# Patient Record
Sex: Female | Born: 1980
Health system: Southern US, Community
[De-identification: ages and names within clinical notes are randomized; demographics above are authoritative.]

## PROBLEM LIST (undated history)

## (undated) ENCOUNTER — Inpatient Hospital Stay (HOSPITAL_COMMUNITY): Payer: Self-pay

## (undated) DIAGNOSIS — Z8619 Personal history of other infectious and parasitic diseases: Secondary | ICD-10-CM

## (undated) DIAGNOSIS — F1011 Alcohol abuse, in remission: Secondary | ICD-10-CM

## (undated) DIAGNOSIS — F988 Other specified behavioral and emotional disorders with onset usually occurring in childhood and adolescence: Secondary | ICD-10-CM

## (undated) DIAGNOSIS — F329 Major depressive disorder, single episode, unspecified: Secondary | ICD-10-CM

## (undated) DIAGNOSIS — R011 Cardiac murmur, unspecified: Secondary | ICD-10-CM

## (undated) DIAGNOSIS — F32A Depression, unspecified: Secondary | ICD-10-CM

## (undated) DIAGNOSIS — T4145XA Adverse effect of unspecified anesthetic, initial encounter: Secondary | ICD-10-CM

## (undated) DIAGNOSIS — N888 Other specified noninflammatory disorders of cervix uteri: Secondary | ICD-10-CM

## (undated) DIAGNOSIS — F419 Anxiety disorder, unspecified: Secondary | ICD-10-CM

## (undated) DIAGNOSIS — T8859XA Other complications of anesthesia, initial encounter: Secondary | ICD-10-CM

## (undated) DIAGNOSIS — A749 Chlamydial infection, unspecified: Secondary | ICD-10-CM

## (undated) HISTORY — DX: Personal history of other infectious and parasitic diseases: Z86.19

## (undated) HISTORY — PX: WISDOM TOOTH EXTRACTION: SHX21

## (undated) HISTORY — DX: Alcohol abuse, in remission: F10.11

---

## 2007-11-26 ENCOUNTER — Emergency Department (HOSPITAL_COMMUNITY): Admission: EM | Admit: 2007-11-26 | Discharge: 2007-11-27 | Payer: Self-pay | Admitting: Emergency Medicine

## 2008-01-28 ENCOUNTER — Emergency Department (HOSPITAL_COMMUNITY): Admission: EM | Admit: 2008-01-28 | Discharge: 2008-01-28 | Payer: Self-pay | Admitting: Emergency Medicine

## 2008-11-23 ENCOUNTER — Emergency Department (HOSPITAL_COMMUNITY): Admission: EM | Admit: 2008-11-23 | Discharge: 2008-11-23 | Payer: Self-pay | Admitting: Emergency Medicine

## 2009-04-10 ENCOUNTER — Emergency Department (HOSPITAL_COMMUNITY)
Admission: EM | Admit: 2009-04-10 | Discharge: 2009-04-10 | Payer: Self-pay | Source: Home / Self Care | Admitting: Emergency Medicine

## 2009-11-07 ENCOUNTER — Emergency Department (HOSPITAL_COMMUNITY)
Admission: EM | Admit: 2009-11-07 | Discharge: 2009-11-07 | Payer: Self-pay | Source: Home / Self Care | Admitting: Emergency Medicine

## 2009-12-04 ENCOUNTER — Emergency Department (HOSPITAL_COMMUNITY)
Admission: EM | Admit: 2009-12-04 | Discharge: 2009-12-05 | Payer: Self-pay | Source: Home / Self Care | Admitting: Emergency Medicine

## 2009-12-14 ENCOUNTER — Ambulatory Visit: Payer: Self-pay | Admitting: Obstetrics & Gynecology

## 2009-12-17 ENCOUNTER — Emergency Department (HOSPITAL_COMMUNITY): Admission: EM | Admit: 2009-12-17 | Discharge: 2009-12-17 | Payer: Self-pay | Source: Home / Self Care

## 2009-12-19 ENCOUNTER — Inpatient Hospital Stay (HOSPITAL_COMMUNITY)
Admission: AD | Admit: 2009-12-19 | Discharge: 2009-12-19 | Payer: Self-pay | Source: Home / Self Care | Admitting: Family Medicine

## 2009-12-19 ENCOUNTER — Ambulatory Visit: Payer: Self-pay | Admitting: Family Medicine

## 2009-12-19 ENCOUNTER — Encounter: Payer: Self-pay | Admitting: Family Medicine

## 2010-01-10 ENCOUNTER — Encounter: Payer: Self-pay | Admitting: Emergency Medicine

## 2010-01-11 ENCOUNTER — Inpatient Hospital Stay (HOSPITAL_COMMUNITY): Admission: AD | Admit: 2010-01-11 | Discharge: 2010-01-12 | Payer: Self-pay | Admitting: Obstetrics and Gynecology

## 2010-03-13 ENCOUNTER — Emergency Department (HOSPITAL_COMMUNITY)
Admission: EM | Admit: 2010-03-13 | Discharge: 2010-03-13 | Payer: Self-pay | Source: Home / Self Care | Admitting: Emergency Medicine

## 2010-05-07 LAB — DIFFERENTIAL
Eosinophils Absolute: 0 10*3/uL (ref 0.0–0.7)
Eosinophils Relative: 0 % (ref 0–5)
Eosinophils Relative: 1 % (ref 0–5)
Lymphocytes Relative: 15 % (ref 12–46)
Lymphs Abs: 1 10*3/uL (ref 0.7–4.0)
Lymphs Abs: 2 10*3/uL (ref 0.7–4.0)
Monocytes Absolute: 0.7 10*3/uL (ref 0.1–1.0)
Monocytes Absolute: 1.1 10*3/uL — ABNORMAL HIGH (ref 0.1–1.0)
Monocytes Relative: 4 % (ref 3–12)

## 2010-05-07 LAB — URINALYSIS, ROUTINE W REFLEX MICROSCOPIC
Glucose, UA: NEGATIVE mg/dL
Ketones, ur: NEGATIVE mg/dL
Protein, ur: NEGATIVE mg/dL
Urobilinogen, UA: 0.2 mg/dL (ref 0.0–1.0)

## 2010-05-07 LAB — GC/CHLAMYDIA PROBE AMP, GENITAL
Chlamydia, DNA Probe: NEGATIVE
GC Probe Amp, Genital: NEGATIVE

## 2010-05-07 LAB — BASIC METABOLIC PANEL
CO2: 23 mEq/L (ref 19–32)
Chloride: 102 mEq/L (ref 96–112)
GFR calc Af Amer: >60 mL/min (ref >60)
Glucose, Bld: 103 mg/dL — ABNORMAL HIGH (ref 70–99)
Sodium: 134 mEq/L — ABNORMAL LOW (ref 135–145)

## 2010-05-07 LAB — CBC
HCT: 35.7 % — ABNORMAL LOW (ref 36.0–46.0)
HCT: 33.3 % — ABNORMAL LOW (ref 36.0–46.0)
Hemoglobin: 11.9 g/dL — ABNORMAL LOW (ref 12.0–15.0)
MCH: 28.8 pg (ref 26.0–34.0)
MCV: 86.3 fL (ref 78.0–100.0)
MCV: 87.6 fL (ref 78.0–100.0)
RBC: 4.14 MIL/uL (ref 3.87–5.11)
RBC: 3.8 MIL/uL — ABNORMAL LOW (ref 3.87–5.11)
RDW: 14.2 % (ref 11.5–15.5)
WBC: 13.2 10*3/uL — ABNORMAL HIGH (ref 4.0–10.5)

## 2010-05-07 LAB — WET PREP, GENITAL

## 2010-05-07 LAB — LIPASE, BLOOD: Lipase: 22 U/L (ref 11–59)

## 2010-05-07 LAB — HEPATIC FUNCTION PANEL
Albumin: 3.8 g/dL (ref 3.5–5.2)
Alkaline Phosphatase: 57 U/L (ref 39–117)
Bilirubin, Direct: 0.1 mg/dL (ref 0.0–0.3)
Total Bilirubin: 0.4 mg/dL (ref 0.3–1.2)

## 2010-05-07 LAB — HCG, QUANTITATIVE, PREGNANCY: hCG, Beta Chain, Quant, S: 39136 m[IU]/mL — ABNORMAL HIGH (ref <5)

## 2010-05-08 LAB — URINALYSIS, ROUTINE W REFLEX MICROSCOPIC
Nitrite: NEGATIVE
Specific Gravity, Urine: 1.02 (ref 1.005–1.030)
Urobilinogen, UA: 0.2 mg/dL (ref 0.0–1.0)

## 2010-05-08 LAB — CBC
HCT: 38.3 % (ref 36.0–46.0)
Hemoglobin: 12.5 g/dL (ref 12.0–15.0)
MCH: 28.6 pg (ref 26.0–34.0)
MCHC: 32.7 g/dL (ref 30.0–36.0)

## 2010-05-08 LAB — WET PREP, GENITAL: Clue Cells Wet Prep HPF POC: NONE SEEN

## 2010-05-08 LAB — RAPID URINE DRUG SCREEN, HOSP PERFORMED
Barbiturates: NOT DETECTED
Opiates: NOT DETECTED

## 2010-05-09 LAB — URINALYSIS, ROUTINE W REFLEX MICROSCOPIC
Bilirubin Urine: NEGATIVE
Glucose, UA: NEGATIVE mg/dL
Hgb urine dipstick: NEGATIVE
Ketones, ur: NEGATIVE mg/dL
Nitrite: NEGATIVE
Protein, ur: NEGATIVE mg/dL
Protein, ur: NEGATIVE mg/dL
Specific Gravity, Urine: 1.009 (ref 1.005–1.030)
Urobilinogen, UA: 1 mg/dL (ref 0.0–1.0)

## 2010-05-09 LAB — URINE MICROSCOPIC-ADD ON

## 2010-05-09 LAB — DIFFERENTIAL
Basophils Absolute: 0 10*3/uL (ref 0.0–0.1)
Eosinophils Absolute: 0.1 10*3/uL (ref 0.0–0.7)
Lymphs Abs: 2.8 10*3/uL (ref 0.7–4.0)
Neutrophils Relative %: 69 % (ref 43–77)

## 2010-05-09 LAB — GC/CHLAMYDIA PROBE AMP, GENITAL
Chlamydia, DNA Probe: POSITIVE — AB
GC Probe Amp, Genital: NEGATIVE

## 2010-05-09 LAB — CBC
MCH: 28.1 pg (ref 26.0–34.0)
Platelets: 220 10*3/uL (ref 150–400)
RBC: 4.49 MIL/uL (ref 3.87–5.11)
WBC: 13 10*3/uL — ABNORMAL HIGH (ref 4.0–10.5)

## 2010-05-09 LAB — ABO/RH: ABO/RH(D): O NEG

## 2010-05-09 LAB — HCG, QUANTITATIVE, PREGNANCY: hCG, Beta Chain, Quant, S: 1685 m[IU]/mL — ABNORMAL HIGH (ref <5)

## 2010-05-09 LAB — WET PREP, GENITAL: Trich, Wet Prep: NONE SEEN

## 2010-05-15 LAB — COMPREHENSIVE METABOLIC PANEL
AST: 18 U/L (ref 0–37)
Albumin: 4 g/dL (ref 3.5–5.2)
BUN: 11 mg/dL (ref 6–23)
Calcium: 9.1 mg/dL (ref 8.4–10.5)
Creatinine, Ser: 0.82 mg/dL (ref 0.4–1.2)
GFR calc Af Amer: >60 mL/min (ref >60)

## 2010-05-15 LAB — DIFFERENTIAL
Eosinophils Relative: 1 % (ref 0–5)
Lymphocytes Relative: 24 % (ref 12–46)
Lymphs Abs: 2.5 10*3/uL (ref 0.7–4.0)
Monocytes Absolute: 0.8 10*3/uL (ref 0.1–1.0)
Neutro Abs: 6.7 10*3/uL (ref 1.7–7.7)

## 2010-05-15 LAB — CBC
MCHC: 33 g/dL (ref 30.0–36.0)
MCV: 85.8 fL (ref 78.0–100.0)
Platelets: 230 10*3/uL (ref 150–400)
RDW: 14.4 % (ref 11.5–15.5)
WBC: 10.1 10*3/uL (ref 4.0–10.5)

## 2010-05-31 LAB — POCT PREGNANCY, URINE: Preg Test, Ur: NEGATIVE

## 2010-07-05 ENCOUNTER — Emergency Department (HOSPITAL_COMMUNITY)
Admission: EM | Admit: 2010-07-05 | Discharge: 2010-07-06 | Disposition: A | Discharge status: Home / Self Care | Payer: Medicaid Other | Attending: Emergency Medicine | Admitting: Emergency Medicine

## 2010-07-05 DIAGNOSIS — T148XXA Other injury of unspecified body region, initial encounter: Secondary | ICD-10-CM | POA: Insufficient documentation

## 2010-07-05 DIAGNOSIS — R079 Chest pain, unspecified: Secondary | ICD-10-CM | POA: Insufficient documentation

## 2010-07-05 DIAGNOSIS — W1809XA Striking against other object with subsequent fall, initial encounter: Secondary | ICD-10-CM | POA: Insufficient documentation

## 2010-07-06 ENCOUNTER — Emergency Department (HOSPITAL_COMMUNITY): Payer: Medicaid Other

## 2010-09-30 ENCOUNTER — Inpatient Hospital Stay (HOSPITAL_COMMUNITY): Payer: Medicaid Other

## 2010-09-30 ENCOUNTER — Inpatient Hospital Stay (HOSPITAL_COMMUNITY)
Admission: AD | Admit: 2010-09-30 | Discharge: 2010-09-30 | Disposition: A | Discharge status: Home / Self Care | Payer: Medicaid Other | Source: Ambulatory Visit | Attending: Obstetrics & Gynecology | Admitting: Obstetrics & Gynecology

## 2010-09-30 ENCOUNTER — Encounter (HOSPITAL_COMMUNITY): Payer: Self-pay | Admitting: *Deleted

## 2010-09-30 DIAGNOSIS — N939 Abnormal uterine and vaginal bleeding, unspecified: Secondary | ICD-10-CM

## 2010-09-30 DIAGNOSIS — N898 Other specified noninflammatory disorders of vagina: Secondary | ICD-10-CM

## 2010-09-30 DIAGNOSIS — N920 Excessive and frequent menstruation with regular cycle: Secondary | ICD-10-CM | POA: Insufficient documentation

## 2010-09-30 HISTORY — DX: Other specified noninflammatory disorders of cervix uteri: N88.8

## 2010-09-30 HISTORY — DX: Other specified behavioral and emotional disorders with onset usually occurring in childhood and adolescence: F98.8

## 2010-09-30 HISTORY — DX: Major depressive disorder, single episode, unspecified: F32.9

## 2010-09-30 HISTORY — DX: Depression, unspecified: F32.A

## 2010-09-30 HISTORY — DX: Cardiac murmur, unspecified: R01.1

## 2010-09-30 HISTORY — DX: Chlamydial infection, unspecified: A74.9

## 2010-09-30 HISTORY — DX: Anxiety disorder, unspecified: F41.9

## 2010-09-30 LAB — URINALYSIS, ROUTINE W REFLEX MICROSCOPIC
Bilirubin Urine: NEGATIVE
Nitrite: NEGATIVE
Specific Gravity, Urine: >1.03 — ABNORMAL HIGH (ref 1.005–1.030)
Urobilinogen, UA: 4 mg/dL — ABNORMAL HIGH (ref 0.0–1.0)
pH: 6.5 (ref 5.0–8.0)

## 2010-09-30 LAB — URINE MICROSCOPIC-ADD ON

## 2010-09-30 LAB — CBC
HCT: 35.1 % — ABNORMAL LOW (ref 36.0–46.0)
Hemoglobin: 11.1 g/dL — ABNORMAL LOW (ref 12.0–15.0)
MCHC: 31.6 g/dL (ref 30.0–36.0)
WBC: 12.1 10*3/uL — ABNORMAL HIGH (ref 4.0–10.5)

## 2010-09-30 LAB — WET PREP, GENITAL
Trich, Wet Prep: NONE SEEN
Yeast Wet Prep HPF POC: NONE SEEN

## 2010-09-30 LAB — POCT PREGNANCY, URINE: Preg Test, Ur: NEGATIVE

## 2010-09-30 MED ORDER — GI COCKTAIL ~~LOC~~
30.0000 mL | Freq: Three times a day (TID) | ORAL | Status: DC | PRN
  Administered 2010-09-30: 30 mL via ORAL
  Filled 2010-09-30: qty 30

## 2010-09-30 MED ORDER — KETOROLAC TROMETHAMINE 60 MG/2ML IM SOLN
60.0000 mg | Freq: Once | INTRAMUSCULAR | Status: AC
  Administered 2010-09-30: 60 mg via INTRAMUSCULAR
  Filled 2010-09-30: qty 2

## 2010-09-30 NOTE — Progress Notes (Signed)
Pt states has bled this entire month, heavy then spotting, towards normal cycle time had heavy bleeding and clots. Unsure if pregnant. Having lower abdominal pains/cramping as well, bilaterally.Denies vaginal d/c changes besides blood.
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
## 2010-09-30 NOTE — ED Notes (Signed)
Pt. On cell phone constantly. Did not hang up when provider discussed plan of care or did pelvic exam.
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
## 2010-10-18 ENCOUNTER — Emergency Department (HOSPITAL_COMMUNITY)
Admission: EM | Admit: 2010-10-18 | Discharge: 2010-10-18 | Disposition: A | Discharge status: Home / Self Care | Payer: Medicaid Other | Attending: Emergency Medicine | Admitting: Emergency Medicine

## 2010-10-18 DIAGNOSIS — F341 Dysthymic disorder: Secondary | ICD-10-CM | POA: Insufficient documentation

## 2010-10-18 DIAGNOSIS — F411 Generalized anxiety disorder: Secondary | ICD-10-CM | POA: Insufficient documentation

## 2010-10-18 DIAGNOSIS — J020 Streptococcal pharyngitis: Secondary | ICD-10-CM | POA: Insufficient documentation

## 2010-10-18 DIAGNOSIS — R51 Headache: Secondary | ICD-10-CM | POA: Insufficient documentation

## 2010-10-18 DIAGNOSIS — R599 Enlarged lymph nodes, unspecified: Secondary | ICD-10-CM | POA: Insufficient documentation

## 2010-10-18 LAB — RAPID STREP SCREEN (MED CTR MEBANE ONLY): Streptococcus, Group A Screen (Direct): POSITIVE — AB

## 2011-06-07 ENCOUNTER — Emergency Department (INDEPENDENT_AMBULATORY_CARE_PROVIDER_SITE_OTHER): Payer: Self-pay

## 2011-06-07 ENCOUNTER — Emergency Department (HOSPITAL_BASED_OUTPATIENT_CLINIC_OR_DEPARTMENT_OTHER)
Admission: EM | Admit: 2011-06-07 | Discharge: 2011-06-07 | Disposition: A | Discharge status: Home / Self Care | Payer: Self-pay | Attending: Emergency Medicine | Admitting: Emergency Medicine

## 2011-06-07 DIAGNOSIS — Z0489 Encounter for examination and observation for other specified reasons: Secondary | ICD-10-CM

## 2011-06-07 DIAGNOSIS — S01511A Laceration without foreign body of lip, initial encounter: Secondary | ICD-10-CM

## 2011-06-07 DIAGNOSIS — F988 Other specified behavioral and emotional disorders with onset usually occurring in childhood and adolescence: Secondary | ICD-10-CM | POA: Insufficient documentation

## 2011-06-07 DIAGNOSIS — T07XXXA Unspecified multiple injuries, initial encounter: Secondary | ICD-10-CM

## 2011-06-07 DIAGNOSIS — F341 Dysthymic disorder: Secondary | ICD-10-CM | POA: Insufficient documentation

## 2011-06-07 DIAGNOSIS — J9819 Other pulmonary collapse: Secondary | ICD-10-CM

## 2011-06-07 DIAGNOSIS — S01501A Unspecified open wound of lip, initial encounter: Secondary | ICD-10-CM | POA: Insufficient documentation

## 2011-06-07 DIAGNOSIS — R011 Cardiac murmur, unspecified: Secondary | ICD-10-CM

## 2011-06-07 DIAGNOSIS — R220 Localized swelling, mass and lump, head: Secondary | ICD-10-CM | POA: Insufficient documentation

## 2011-06-07 LAB — CBC
HCT: 40.8 % (ref 36.0–46.0)
MCV: 85.2 fL (ref 78.0–100.0)
Platelets: 229 10*3/uL (ref 150–400)
RBC: 4.79 MIL/uL (ref 3.87–5.11)
RDW: 13.2 % (ref 11.5–15.5)
WBC: 26.4 10*3/uL — ABNORMAL HIGH (ref 4.0–10.5)

## 2011-06-07 LAB — DIFFERENTIAL
Basophils Absolute: 0 10*3/uL (ref 0.0–0.1)
Eosinophils Relative: 0 % (ref 0–5)
Lymphocytes Relative: 6 % — ABNORMAL LOW (ref 12–46)
Monocytes Relative: 5 % (ref 3–12)
Neutro Abs: 23.5 10*3/uL — ABNORMAL HIGH (ref 1.7–7.7)

## 2011-06-07 LAB — BASIC METABOLIC PANEL
CO2: 23 mEq/L (ref 19–32)
Chloride: 107 mEq/L (ref 96–112)
GFR calc Af Amer: >90 mL/min (ref >90)
Potassium: 3.2 mEq/L — ABNORMAL LOW (ref 3.5–5.1)

## 2011-06-07 LAB — PREGNANCY, URINE: Preg Test, Ur: NEGATIVE

## 2011-06-07 MED ORDER — IOHEXOL 300 MG/ML  SOLN
100.0000 mL | Freq: Once | INTRAMUSCULAR | Status: AC | PRN
  Administered 2011-06-07: 100 mL via INTRAVENOUS

## 2011-06-07 MED ORDER — TRAMADOL HCL 50 MG PO TABS
50.0000 mg | ORAL_TABLET | Freq: Once | ORAL | Status: AC
  Administered 2011-06-07: 50 mg via ORAL
  Filled 2011-06-07: qty 1

## 2011-06-07 MED ORDER — TRAMADOL HCL 50 MG PO TABS: 50.0000 mg | ORAL_TABLET | Freq: Four times a day (QID) | ORAL | Status: DC | PRN
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
## 2011-06-12 ENCOUNTER — Emergency Department (HOSPITAL_COMMUNITY)
Admission: EM | Admit: 2011-06-12 | Discharge: 2011-06-12 | Disposition: A | Discharge status: Home / Self Care | Payer: Self-pay | Attending: Emergency Medicine | Admitting: Emergency Medicine

## 2011-06-12 ENCOUNTER — Emergency Department (HOSPITAL_COMMUNITY): Payer: Self-pay

## 2011-06-12 ENCOUNTER — Encounter (HOSPITAL_COMMUNITY): Payer: Self-pay | Admitting: Emergency Medicine

## 2011-06-12 DIAGNOSIS — R071 Chest pain on breathing: Secondary | ICD-10-CM | POA: Insufficient documentation

## 2011-06-12 DIAGNOSIS — M62838 Other muscle spasm: Secondary | ICD-10-CM | POA: Insufficient documentation

## 2011-06-12 DIAGNOSIS — F341 Dysthymic disorder: Secondary | ICD-10-CM | POA: Insufficient documentation

## 2011-06-12 DIAGNOSIS — M549 Dorsalgia, unspecified: Secondary | ICD-10-CM | POA: Insufficient documentation

## 2011-06-12 DIAGNOSIS — R0789 Other chest pain: Secondary | ICD-10-CM

## 2011-06-12 LAB — BASIC METABOLIC PANEL
BUN: 12 mg/dL (ref 6–23)
CO2: 23 mEq/L (ref 19–32)
Chloride: 106 mEq/L (ref 96–112)
GFR calc Af Amer: >90 mL/min (ref >90)
Glucose, Bld: 97 mg/dL (ref 70–99)
Potassium: 3.9 mEq/L (ref 3.5–5.1)

## 2011-06-12 LAB — CBC
HCT: 38.1 % (ref 36.0–46.0)
Hemoglobin: 12.3 g/dL (ref 12.0–15.0)
RBC: 4.42 MIL/uL (ref 3.87–5.11)

## 2011-06-12 LAB — DIFFERENTIAL
Lymphocytes Relative: 26 % (ref 12–46)
Lymphs Abs: 2.3 10*3/uL (ref 0.7–4.0)
Monocytes Absolute: 0.6 10*3/uL (ref 0.1–1.0)
Monocytes Relative: 6 % (ref 3–12)
Neutro Abs: 5.8 10*3/uL (ref 1.7–7.7)
Neutrophils Relative %: 66 % (ref 43–77)

## 2011-06-12 MED ORDER — OXYCODONE-ACETAMINOPHEN 5-325 MG PO TABS
2.0000 | ORAL_TABLET | Freq: Once | ORAL | Status: AC
  Administered 2011-06-12: 2 via ORAL
  Filled 2011-06-12: qty 2

## 2011-06-12 MED ORDER — DIAZEPAM 5 MG PO TABS
5.0000 mg | ORAL_TABLET | Freq: Once | ORAL | Status: AC
  Administered 2011-06-12: 5 mg via ORAL
  Filled 2011-06-12: qty 1

## 2011-06-12 MED ORDER — DIAZEPAM 5 MG PO TABS: 5.0000 mg | ORAL_TABLET | Freq: Three times a day (TID) | ORAL | Status: DC | PRN

## 2011-06-12 MED ORDER — OXYCODONE-ACETAMINOPHEN 5-325 MG PO TABS: 1.0000 | ORAL_TABLET | Freq: Four times a day (QID) | ORAL | Status: AC | PRN

## 2011-06-12 MED ORDER — OXYCODONE-ACETAMINOPHEN 5-325 MG PO TABS: 2.0000 | ORAL_TABLET | ORAL | Status: DC | PRN

## 2011-06-12 MED ORDER — DIAZEPAM 5 MG PO TABS: 5.0000 mg | ORAL_TABLET | Freq: Two times a day (BID) | ORAL | Status: AC
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
## 2011-06-12 NOTE — ED Provider Notes (Signed)
Patient returned emergency department for prescriptions that were provided during the visit during the night patient was prescribed Percocet and Valium prescriptions were not signed so therefore would not be filled by the pharmacist. Patient brought [facility] back a prescription for steroid and new prescriptions printed by me.  Nicole Jakes, MD 06/12/11 [phone]

## 2011-06-12 NOTE — ED Notes (Signed)
Patient is AOx4 and comfortable with her discharge instructions.  Patient has a ride home. 

## 2011-06-12 NOTE — ED Notes (Signed)
IS given & explained with rationale & teaching.

## 2011-06-12 NOTE — Discharge Instructions (Signed)
Muscle Spasm  Take medications as prescribed.  Warm moist heat, either from warm bath, hot water bottle, or heating pad to the spasm area will help with symptoms.  Expect to be sore for 7-10 days.  Follow up with your regular doctor.  If you do not have a doctor, follow up with one of the numbers listed.  Return to the ER for worsening pain, new weakness, numbness, loss of bowel or bladder control, or other concerning symptoms.  PSYCH ANXIOLYTICS BENZODIAZEPINES  PSYCH ANXIOLYTICS BENZODIAZEPINES: You have been prescribed a medication that belongs to a class called Benzodiazepines.      These medicines are used to treat anxiety (nervousness), tremors, seizures, vertigo, insomnia, nausea (especially that associated with chemotherapy), alcohol or sedative drug withdrawal, and muscle spasm; they may also be given (usually intravenously) in the ED for sedation during procedures. This medication is a "scheduled substance" that means it is against the law to share it or give it to anyone else.     You have been given a medication, or a prescription for a medication, that causes drowsiness or dizziness.  DO NOT drive a car, operate machinery, ride a bike, or perform jobs that require you to be alert until you know how you are going to react to this medicine.     Make sure your doctor knows if you have any of these conditions before you take this medication:  an alcohol or drug abuse problem, depression, glaucoma, kidney or liver disease, lung disease or breathing difficulties, myasthenia gravis, Parkinson's disease.     If you are on any of the following medications make sure that your doctor knows before you start this medication as they can cause some undesirable interactions:  Seizure medicines (used for convulsion or epilepsy), chloroquine, cimetidine (Tagamet), digoxin (Lanoxin), disulfiram (Antabuse), or erythromycin.     DO NOT drink alcoholic beverages while taking this medicine.     If you  become dizzy, sit or lie down at the first signs.  You should be careful going up and down stairs.     This medication may cause side-effects.  If they are bothersome, discontinue the medication.  If they are severe, follow-up with your physician or return to the Emergency Department for a recheck.  These side-effects include:  dizziness, depression, headaches, blurry vision, and problems sleeping. Tell your doctor if you are taking any of the following medicines:      DO NOT drink alcoholic beverages while taking this medicine.     Medications for your stomach, Cyclosporin, Medications for your heart or blood pressure, Diflucan, Theophylline, Isoniazid, Antibiotics, Migraine medicines, Medications for seizures, depression, or mental illness.     If you become dizzy, sit or lie down at the first signs.  You should be careful going up and down stairs. DO NOT take more of this medicine than prescribed.  Taking too much of this medicine can cause DEATH.      If you miss a dose do not "double up."  DO NOT take extra doses as this will not help you feel any better any faster.  It may even cause unwanted side-effects.     Contact your doctor immediately if you develop an allergic reaction, you feel lightheaded, confused, drowsy, or experience thoughts of hurting yourself or others.  Call also if you experience yellowing of the eyes or skin, or abnormal muscle twitching or movements.     DO NOT take this medication if you are pregnant or are nursing  or you are actively trying to become pregnant.     Keep this medication out of the reach of children.  Always keep this medication in child-proof containers.  DO NOT give your medication to anyone else. This drug may be habit-forming (addictive).  DO NOT use it for more than one week without discussing it with your regular doctor.  You have been given a medication, or a prescription for a medication, that causes drowsiness or dizziness.  DO NOT drive a  car, operate machinery, ride a bike, or perform jobs that require you to be alert until you know how you are going to react to this medicine.  THESE INSTRUCTIONS ARE NOT COMPREHENSIVE (complete):  Ask your pharmacist for additional information and precautions for this medication.  PAIN ACETAMINOPHEN OXYCODONE  PAIN ACETAMINOPHEN OXYCODONE: You have been given a medication that contains acetaminophen and oxycodone.      This medication is used to relieve pain.     DO NOT take this medication if you have liver disease or drink alcohol on a daily basis.     DO NOT take this medication if you are taking other over-the-counter medications that contain Tylenol or acetaminophen (the active ingredient in Tylenol).     If you have side-effects that you think are caused by this medicine, tell your doctor.     DO NOT drink alcoholic beverages while taking this medicine.     If you become dizzy, sit or lie down at the first signs.  You should be careful going up and down stairs.     If you are pregnant or breastfeeding, notify your doctor before taking this medication.     Keep this medication out of the reach of children.  Always keep this medication in child-proof containers.  DO NOT give your medication to anyone else. This medication can be HABIT-FORMING.  Discontinue use when no longer needed and never give this medication to others.  You have been given a medication, or a prescription for a medication, that causes drowsiness or dizziness.  DO NOT drive a car, operate machinery, or perform jobs that require you to be alert until you know how you are going to react to this medicine.  THESE INSTRUCTIONS ARE NOT COMPREHENSIVE (complete):  Ask your pharmacist for additional information and precautions for this medication.   PAIN, GENERAL - WITH PAIN MEDICATION  PAIN, GENERAL: You have been seen today for treatment of your pain.  The physician who treated you did not feel that the cause of  your pain was dangerous and felt it was OK to treat your symptoms.  You will be given a prescription for pain medication to treat your pain. Use the pain medication as needed for discomfort. It may be to your advantage to take the pain medications regularly, around the clock as directed for the next a few days. By doing this, you can build up a helpful amount of the medicine in your system.  YOU SHOULD SEEK MEDICAL ATTENTION IMMEDIATELY, EITHER HERE OR AT THE NEAREST EMERGENCY DEPARTMENT, IF ANY OF THE FOLLOWING OCCURS:      Your pain becomes worse, despite treatment with pain medications.     You develop any other significant symptoms.  MUSCLE STRAIN, GENERAL  MUSCLE STRAIN, GENERAL: You have been diagnosed with a muscle strain.  Any muscle in the body can be strained. A strain is an injury to muscles where some of the muscle fibers are injured by being stretched or partially torn. This  usually happens by overusing the muscle or performing an activity that the muscle is not used to doing.  Some of the symptoms of a strain include pain, muscle cramping, and soreness to the touch.  Often, the pain and stiffness in the muscle is worse the next day. This is much like what happens when a person begins exercising for the first time. After the exercise session, the person may feel pretty good, however the next day all of the exercised muscles feel stiff and sore.  The general treatment for a strain includes the following:      Resting the affected part.     Pain medication.     Muscle relaxant medications.     Warm compresses (such as a warm, moist towel).     Gentle stretching of the injured muscle.     And when tolerated, gentle massage of the affected area. This injury is self-limited (it gets better on its own) and rarely requires specific treatment.  YOU SHOULD SEEK MEDICAL ATTENTION IMMEDIATELY, EITHER HERE OR AT THE NEAREST EMERGENCY DEPARTMENT, IF ANY OF THE FOLLOWING  OCCURS:      Significant increase in swelling of the affected area.     Worsening pain instead of gradual improvement.     Redness of the skin over the affected area.     Inability to use the affected limb. Weakness or numbness of the limb.  IMPORTANCE OF PRIMARY CARE DOCTOR (EDU)  IMPORTANCE OF PRIMARY CARE DOCTOR (EDU): You have been given instructions to follow up with a primary care physician.  A primary care physician is a doctor who helps with your health maintenance. For example, he or she provides yearly health exams to help determine your general well-being along with regular check-ups to help to identify potential health problems.  Your primary care physician serves as a main resource on all aspects of your health. In addition to treating existing medical conditions, this physician monitors your health over time. Your primary doctor can help you to recognize symptoms, or changes in your body that could be signs of new illness. Primary care physicians can look at the big picture, including your lifestyle and family history. They can help plan the best ways of staying healthy and leading a long, productive life. They are also an important part in making referrals to specialists (such as doctors who specialize in specific disease conditions such as diabetes, heart disease, etc.).  There are many types of physicians who provide primary care. They all offer the benefits of a lasting, personal relationship based upon mutual trust and a thorough knowledge of an individual person. They also provide a wide range of healthcare services.      Family Medicine physicians provide comprehensive care for all family members, from newborns through older adults.     Internal Medicine physicians specialize in meeting the complete healthcare needs of adults, from teenagers through seniors, providing both primary and advanced levels of care.     Obstetrician/gynecologists often serve as primary  physicians for women, performing routine physicals and health screenings in addition to obstetrical and gynecological care.     Pediatricians are experts in primary care for children, usually from infancy through the teen years. Primary care doctors may be either MDs or DOs. With today's modern medical training, the differences between an MD (Medical Doctor) and a D.O. (Doctor of Osteopathic Medicine) are minimal. Both MD's and DOs go to medical school and complete residencies in various medical specialties.  If you  do not have a primary care physician, it takes a little homework and determination on your part. There are several options available in selecting the most appropriate doctor for your care. There are referrals lines in your local area as well as specialists that work with your specific health care plan. Many people find a physician through word-of-mouth, asking their friends, neighbors or relatives. There are also referral lines in your local area. Hospital physician referral services are also another option. Your health care plan may also offer referral services and most health plans offer the "Ask A Nurse" service. Referral services offer backgrounds of potential physicians, their educational and practice history, age range, office locations and hours, and the types of insurance coverage that they accept.  When you have decided which doctor may be right for you, make an appointment to ask questions about issues that are important to you. Frequently asked questions include the following:      Is the doctor on staff at a hospital? Which hospital?     What is the doctor's educational background?     Does the doctor specialize in certain areas of medicine?     How many years has his or her practice been established?     Is the doctor in practice by himself or herself, or in a group practice?     Is his or her office conveniently located?     What hours are available for  appointments?     What types of insurance coverage does the doctor accept?     If you're on Medicare or Medicaid, does the doctor accept these plans?     How far in advance do you have to make an appointment? Are same-day appointments available?     How does the doctor handle situations when you need to see a doctor urgently?     What is the doctor's fee schedule? When is payment expected and how can it be made? When seeing a patient for the first time in a non-emergency situation, most doctors will begin a medical chart. This chart includes information about your health history. This record should include your present state of health, personal statistics (age, height, weight, occupation, whether you're a smoker or non-smoker), and your family history.  Establishing a GOOD RAPPORT (relationship) with your family doctor is EXTREMELY important! A PCP (Primary Care Physician) is the cornerstone of your care and should be the first person you call with any health concerns or problems. Being an established patient is VERY important so that you can be seen quickly when an illness or injury does occur. Plan ahead and make an appointment with your chosen physician to become an established patient of his or her practice.  If you develop symptoms of Shortness of Breath, Chest Pain, Swelling of lips, mouth or tongue or if your condition becomes worse with any new symptoms, see your doctor or return to the Emergency Department for immediate care. Emergency services are not intended to be a substitute for comprehensive medical attention.  Please contact your doctor for follow up if not improving as expected.   Call your doctor in 5-7 days or as directed if there is no improvement.   Community Resources: *IF YOU ARE IN IMMEDIATE DANGER CALL 911!  Abuse/Neglect:  Family Services Crisis Hotline [facility]): (571) 142-8292 [facility]): 8641788853  After hours,  holidays and weekends: 640-867-2842 National Domestic Violence Hotline: 252-524-3530  Mental Health: [facility] Mental Health:  [address]: 478-260-3389  Health Clinics:  [facility]): [phone] Monday - Friday 8 AM - 9 PM, Saturday and Sunday 10 AM - 9 PM  [facility] [place]: [phone] Monday - Friday 8 AM - 5 PM  [facility]  [address]: [phone] Monday- Friday 8:30 AM - 5:30 PM, Sat 9 AM - 1 PM  [facility] [address]: [phone] [facility] [address]: [phone] [facility] [address]: [phone]  24 [facility]: [address] [phone]  Cultures: If culture results are positive, we will notify you if a change in treatment is necessary.  LABORATORY TESTS:         If you had any labs drawn in the ED that have not resulted by the time you are discharged home, we will review these lab results and the treatment given to you.  If there is any further treatment or notification needed, we will contact you by phone, or letter.  "PLEASE ENSURE THAT YOU HAVE GIVEN US YOUR CURRENT WORKING PHONE NUMBER AND YOUR CURRENT ADDRESS, so that we can contact you if needed."  RADIOLOGY TESTS:  If the referred physician wants today\'s x-rays, please call the hospital\'s Radiology Department the day before your doctor\'s appointment. [place]     [phone] [place]   [phone] [place]     [phone]  Our doctors and staff appreciate your choosing [place] for your emergency medical care needs. We are here to serve you.  Chest Wall Pain Chest wall pain is pain felt in or around the chest bones and muscles. It may take up to 6 weeks to get better. It may take longer if you are active. Chest wall pain can happen on its own. Other times, things like germs, injury, coughing, or exercise can cause the pain. HOME CARE   Avoid activities that make you tired or  cause pain. Try not to use your chest, belly (abdominal), or side muscles. Do not use heavy weights.   Put ice on the sore area.   Put ice in a plastic bag.   Place a towel between your skin and the bag.   Leave the ice on for 15 to 20 minutes for the first 2 days.   Only take medicine as told by your doctor.  GET HELP RIGHT AWAY IF:   You have more pain or are very uncomfortable.   You have a fever.   Your chest pain gets worse.   You have new problems.   You feel sick to your stomach (nauseous) or throw up (vomit).   You start to sweat or feel lightheaded.   You have a cough with mucus (phlegm).   You cough up blood.  MAKE SURE YOU:   Understand these instructions.   Will watch your condition.   Will get help right away if you are not doing well or get worse.  Document Released: 07/30/2007 Document Revised: 01/30/2011 Document Reviewed: 10/07/2010 [facility] Patient Information 2012 [place], [place].

## 2011-06-22 ENCOUNTER — Emergency Department (INDEPENDENT_AMBULATORY_CARE_PROVIDER_SITE_OTHER): Payer: Self-pay

## 2011-06-22 ENCOUNTER — Emergency Department (HOSPITAL_BASED_OUTPATIENT_CLINIC_OR_DEPARTMENT_OTHER)
Admission: EM | Admit: 2011-06-22 | Discharge: 2011-06-23 | Disposition: A | Discharge status: Home / Self Care | Payer: Self-pay | Attending: Emergency Medicine | Admitting: Emergency Medicine

## 2011-06-22 ENCOUNTER — Encounter (HOSPITAL_BASED_OUTPATIENT_CLINIC_OR_DEPARTMENT_OTHER): Payer: Self-pay | Admitting: *Deleted

## 2011-06-22 DIAGNOSIS — R509 Fever, unspecified: Secondary | ICD-10-CM | POA: Insufficient documentation

## 2011-06-22 DIAGNOSIS — R05 Cough: Secondary | ICD-10-CM | POA: Insufficient documentation

## 2011-06-22 DIAGNOSIS — F17200 Nicotine dependence, unspecified, uncomplicated: Secondary | ICD-10-CM | POA: Insufficient documentation

## 2011-06-22 DIAGNOSIS — R5381 Other malaise: Secondary | ICD-10-CM

## 2011-06-22 DIAGNOSIS — R5383 Other fatigue: Secondary | ICD-10-CM

## 2011-06-22 DIAGNOSIS — IMO0001 Reserved for inherently not codable concepts without codable children: Secondary | ICD-10-CM | POA: Insufficient documentation

## 2011-06-22 DIAGNOSIS — R059 Cough, unspecified: Secondary | ICD-10-CM | POA: Insufficient documentation

## 2011-06-22 MED ORDER — SODIUM CHLORIDE 0.9 % IV BOLUS (SEPSIS)
1000.0000 mL | Freq: Once | INTRAVENOUS | Status: AC
  Administered 2011-06-22: 1000 mL via INTRAVENOUS

## 2011-06-22 MED ORDER — IBUPROFEN 800 MG PO TABS
800.0000 mg | ORAL_TABLET | Freq: Once | ORAL | Status: AC
  Administered 2011-06-22: 800 mg via ORAL
  Filled 2011-06-22: qty 1

## 2011-06-22 MED ORDER — ACETAMINOPHEN 325 MG PO TABS
650.0000 mg | ORAL_TABLET | Freq: Once | ORAL | Status: AC
  Administered 2011-06-22: 650 mg via ORAL
  Filled 2011-06-22: qty 2

## 2011-06-22 NOTE — ED Provider Notes (Signed)
Medical screening examination/treatment/procedure(s) were performed by non-physician practitioner and as supervising physician I was immediately available for consultation/collaboration.  Gerhard Munch, MD 06/22/11 [phone]
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
## 2011-06-22 NOTE — ED Notes (Signed)
I walked with pt to the bathroom and back to the her room.

## 2011-06-22 NOTE — ED Notes (Signed)
Patient transported to X-ray 

## 2012-01-22 ENCOUNTER — Emergency Department (HOSPITAL_COMMUNITY): Payer: Self-pay

## 2012-01-22 ENCOUNTER — Emergency Department (HOSPITAL_COMMUNITY)
Admission: EM | Admit: 2012-01-22 | Discharge: 2012-01-22 | Disposition: A | Discharge status: Home / Self Care | Payer: Self-pay | Attending: Emergency Medicine | Admitting: Emergency Medicine

## 2012-01-22 DIAGNOSIS — J020 Streptococcal pharyngitis: Secondary | ICD-10-CM | POA: Insufficient documentation

## 2012-01-22 DIAGNOSIS — Z8742 Personal history of other diseases of the female genital tract: Secondary | ICD-10-CM | POA: Insufficient documentation

## 2012-01-22 DIAGNOSIS — Z8659 Personal history of other mental and behavioral disorders: Secondary | ICD-10-CM | POA: Insufficient documentation

## 2012-01-22 DIAGNOSIS — Z8679 Personal history of other diseases of the circulatory system: Secondary | ICD-10-CM | POA: Insufficient documentation

## 2012-01-22 DIAGNOSIS — F17200 Nicotine dependence, unspecified, uncomplicated: Secondary | ICD-10-CM | POA: Insufficient documentation

## 2012-01-22 DIAGNOSIS — R509 Fever, unspecified: Secondary | ICD-10-CM | POA: Insufficient documentation

## 2012-01-22 MED ORDER — DEXAMETHASONE SODIUM PHOSPHATE 10 MG/ML IJ SOLN
10.0000 mg | Freq: Once | INTRAMUSCULAR | Status: AC
  Administered 2012-01-22: 10 mg via INTRAVENOUS
  Filled 2012-01-22: qty 1

## 2012-01-22 MED ORDER — DEXAMETHASONE SODIUM PHOSPHATE 10 MG/ML IJ SOLN: 10.0000 mg | Freq: Once | INTRAMUSCULAR | Status: DC

## 2012-01-22 MED ORDER — KETOROLAC TROMETHAMINE 30 MG/ML IJ SOLN
30.0000 mg | Freq: Once | INTRAMUSCULAR | Status: AC
  Administered 2012-01-22: 30 mg via INTRAVENOUS
  Filled 2012-01-22: qty 1

## 2012-01-22 MED ORDER — PENICILLIN G BENZATHINE 1200000 UNIT/2ML IM SUSP
1.2000 10*6.[IU] | Freq: Once | INTRAMUSCULAR | Status: AC
  Administered 2012-01-22: 1.2 10*6.[IU] via INTRAMUSCULAR
  Filled 2012-01-22: qty 2

## 2012-01-22 MED ORDER — LIDOCAINE VISCOUS 2 % MT SOLN
20.0000 mL | Freq: Once | OROMUCOSAL | Status: AC
  Administered 2012-01-22: 20 mL via OROMUCOSAL
  Filled 2012-01-22: qty 20

## 2012-01-22 MED ORDER — LIDOCAINE VISCOUS 2 % MT SOLN: 15.0000 mL | OROMUCOSAL | Status: DC | PRN

## 2012-01-22 NOTE — ED Notes (Signed)
Pt c/o sore throat since this morning. Pt states pain has gotten worse and now it is painful to talk. Pt with no acute distress. Pt states she is unable to swallow her own secretions due to pain. Pt has a slight unproductive cough.

## 2012-01-22 NOTE — ED Provider Notes (Signed)
History     CSN: [id number]  Arrival date & time 01/22/12  2105   First MD Initiated Contact with Patient 01/22/12 2149      Chief Complaint  Patient presents with  . Sore Throat    (Consider location/radiation/quality/duration/timing/severity/associated sxs/prior treatment) HPI  Nicole Robinson is a 31 y.o. female complaining of a sore throat and cough onset this morning. It is painful for the patient to talk or swallow. Patient reports subjective fever. She reports difficulty handling her secretions.  Past Medical History  Diagnosis Date  . Anxiety   . Depression   . ADD (attention deficit disorder)   . Heart murmur   . Cyst of cervix   . Chlamydia     Past Surgical History  Procedure Date  . Cesarean section     No family history on file.  History  Substance Use Topics  . Smoking status: Current Every Day Smoker -- 0.5 packs/day  . Smokeless tobacco: Not on file  . Alcohol Use: 3.3 oz/week    3 Glasses of wine, 3 Drinks containing 0.5 oz of alcohol per week    OB History    Grav Para Term Preterm Abortions TAB SAB Ect Mult Living   4 2 1 1 2 2    2       Review of Systems  Constitutional: Positive for fever.  HENT: Positive for sore throat.   Respiratory: Negative for shortness of breath.   Cardiovascular: Negative for chest pain.  Gastrointestinal: Negative for nausea, vomiting, abdominal pain and diarrhea.  All other systems reviewed and are negative.    Allergies  Vicodin and Voltaren  Home Medications   Current Outpatient Rx  Name  Route  Sig  Dispense  Refill  . ACETAMINOPHEN 500 MG PO TABS   Oral   Take 1,000 mg by mouth every 6 (six) hours as needed. For pain         . IBUPROFEN 200 MG PO TABS   Oral   Take 200 mg by mouth every 6 (six) hours as needed. For pain           BP 133/85  Pulse 96  Temp 101.4 F (38.6 C) (Oral)  Resp 20  SpO2 100%  Physical Exam  Nursing note and vitals reviewed. Constitutional: She is  oriented to person, place, and time. She appears well-developed and well-nourished. No distress.       Voice is low volume.   HENT:  Head: Normocephalic.  Mouth/Throat: Oropharyngeal exudate present.       Tonsillar hypertrophy 2+ bilaterally.  No signs of peritonsillar abscess. Uvula midline.   Eyes: Conjunctivae normal and EOM are normal. Pupils are equal, round, and reactive to light.  Neck: Normal range of motion. Neck supple.       Anterior cervical nodes exquisitely tender to palpation  Cardiovascular: Normal rate and regular rhythm.   Pulmonary/Chest: Effort normal and breath sounds normal. No stridor. No respiratory distress. She has no wheezes. She has no rales. She exhibits no tenderness.  Abdominal: Soft. Bowel sounds are normal.  Musculoskeletal: Normal range of motion.  Neurological: She is alert and oriented to person, place, and time.  Psychiatric: She has a normal mood and affect.    ED Course  Procedures (including critical care time)  Labs Reviewed  RAPID STREP SCREEN - Abnormal; Notable for the following:    Streptococcus, Group A Screen (Direct) POSITIVE (*)     All other components within normal limits  Dg Neck Soft Tissue  01/22/2012  *RADIOLOGY REPORT*  Clinical Data: Sore throat.  NECK SOFT TISSUES - 1+ VIEW  Comparison: No priors.  Findings: AP and lateral views of the neck demonstrates some soft tissue fullness in the region of the lingual tonsils.  Hypopharynx is otherwise normal in appearance, as is the epiglottis and subglottic airway.  IMPRESSION: Fullness in the region of the lingual tonsils.  This should be ammenable to direct inspection on physical examination and correlation for signs symptoms of tonsilitis is recommended.   Original Report Authenticated By: Trudie Reed, M.D.      1. Strep pharyngitis       MDM  Febrile patient with extreme sore throat. Rapid strep is positive. She will be given Bicillin, IV Decadron and  Toradol.  Approximately 5 minutes after receiving IV medication, patient is able to swallow her secretions and her voice is normal fontanelle.        Wynetta Emery, PA-C 01/22/12 2343

## 2012-01-22 NOTE — ED Notes (Signed)
Patient transported to X-ray 

## 2012-01-22 NOTE — ED Notes (Signed)
Pt able to swallow PO fluids. Pt states it is still painful to swallow. Pt speaking in complete sentences. No acute distress. PA aware.

## 2012-01-23 NOTE — ED Provider Notes (Signed)
Medical screening examination/treatment/procedure(s) were performed by non-physician practitioner and as supervising physician I was immediately available for consultation/collaboration.  Toy Baker, MD 01/23/12 2241

## 2012-12-13 ENCOUNTER — Inpatient Hospital Stay (HOSPITAL_COMMUNITY)
Admission: AD | Admit: 2012-12-13 | Discharge: 2012-12-14 | Disposition: A | Discharge status: Home / Self Care | Payer: Medicaid Other | Source: Ambulatory Visit | Attending: Obstetrics & Gynecology | Admitting: Obstetrics & Gynecology

## 2012-12-13 ENCOUNTER — Encounter (HOSPITAL_COMMUNITY): Payer: Self-pay | Admitting: *Deleted

## 2012-12-13 DIAGNOSIS — N72 Inflammatory disease of cervix uteri: Secondary | ICD-10-CM | POA: Insufficient documentation

## 2012-12-13 DIAGNOSIS — R509 Fever, unspecified: Secondary | ICD-10-CM | POA: Insufficient documentation

## 2012-12-13 HISTORY — DX: Other complications of anesthesia, initial encounter: T88.59XA

## 2012-12-13 HISTORY — DX: Adverse effect of unspecified anesthetic, initial encounter: T41.45XA

## 2012-12-13 LAB — COMPREHENSIVE METABOLIC PANEL
BUN: 10 mg/dL (ref 6–23)
Calcium: 8.8 mg/dL (ref 8.4–10.5)
GFR calc Af Amer: 79 mL/min — ABNORMAL LOW (ref >90)
GFR calc non Af Amer: 68 mL/min — ABNORMAL LOW (ref >90)
Glucose, Bld: 110 mg/dL — ABNORMAL HIGH (ref 70–99)
Total Protein: 7.3 g/dL (ref 6.0–8.3)

## 2012-12-13 LAB — URINALYSIS, ROUTINE W REFLEX MICROSCOPIC
Bilirubin Urine: NEGATIVE
Glucose, UA: NEGATIVE mg/dL
Hgb urine dipstick: NEGATIVE
Ketones, ur: NEGATIVE mg/dL
Protein, ur: NEGATIVE mg/dL

## 2012-12-13 LAB — CBC
HCT: 32.1 % — ABNORMAL LOW (ref 36.0–46.0)
Hemoglobin: 10.1 g/dL — ABNORMAL LOW (ref 12.0–15.0)
MCH: 24.8 pg — ABNORMAL LOW (ref 26.0–34.0)
MCHC: 31.5 g/dL (ref 30.0–36.0)
MCV: 78.9 fL (ref 78.0–100.0)

## 2012-12-13 LAB — WET PREP, GENITAL: Yeast Wet Prep HPF POC: NONE SEEN

## 2012-12-13 MED ORDER — CEFTRIAXONE SODIUM 250 MG IJ SOLR
250.0000 mg | Freq: Once | INTRAMUSCULAR | Status: AC
  Administered 2012-12-14: 250 mg via INTRAMUSCULAR
  Filled 2012-12-13: qty 250

## 2012-12-13 MED ORDER — DOXYCYCLINE HYCLATE 100 MG PO TABS
100.0000 mg | ORAL_TABLET | Freq: Once | ORAL | Status: AC
  Administered 2012-12-14: 100 mg via ORAL
  Filled 2012-12-13: qty 1

## 2012-12-13 NOTE — MAU Provider Note (Signed)
History     CSN: [id number]  Arrival date and time: 12/13/12 2138   First Provider Initiated Contact with Patient 12/13/12 2333      No chief complaint on file.  HPI  Nicole Robinson is a 32 y.o. [id number] who presents today with a fever. She states that the fever started today after waking up from a nap. She denies any other symptoms. She denies nausea/vomiting/diarreha. She denies cough, congestion or sore throat. She denies any recent international travel or any close contacts who have traveled internationally. She states that while in the waiting room her sexual partner called and told her that he was treated for a STI and needs to be treated for it again. She does not know what STI. She denies any urinary sx.   Past Medical History  Diagnosis Date  . Anxiety   . Depression   . ADD (attention deficit disorder)   . Heart murmur   . Cyst of cervix   . Chlamydia   . Complication of anesthesia     Past Surgical History  Procedure Laterality Date  . Cesarean section      Family History  Problem Relation Age of Onset  . Diabetes Maternal Aunt   . Diabetes Maternal Grandmother     History  Substance Use Topics  . Smoking status: Current Every Day Smoker -- 0.50 packs/day  . Smokeless tobacco: Not on file  . Alcohol Use: 3.3 oz/week    3 Glasses of wine, 3 Drinks containing 0.5 oz of alcohol per week     Comment: occasional    Allergies:  Allergies  Allergen Reactions  . Vicodin [Hydrocodone-Acetaminophen] Hives and Rash  . Voltaren [Diclofenac Sodium] Hives and Rash    Taking with vicodin, not sure which is cause of allergy    Prescriptions prior to admission  Medication Sig Dispense Refill  . acetaminophen (TYLENOL) 500 MG tablet Take 1,000 mg by mouth every 6 (six) hours as needed. For pain      . ibuprofen (ADVIL,MOTRIN) 200 MG tablet Take 200 mg by mouth every 6 (six) hours as needed. For pain      . lidocaine (XYLOCAINE) 2 % solution Take 15 mLs by mouth  every 3 (three) hours as needed for pain.  100 mL  0    ROS Physical Exam   Blood pressure 125/80, pulse 115, temperature 101.1 F (38.4 C), temperature source Oral, resp. rate 22, height 5\' 2"  (1.575 m), weight 84.936 kg (187 lb 4 oz), last menstrual period 12/02/2012.  Physical Exam  Nursing note and vitals reviewed. Constitutional: She is oriented to person, place, and time. She appears well-developed and well-nourished. No distress.  Cardiovascular: Normal rate.   Respiratory: Effort normal.  GI: Soft. There is no tenderness.  Genitourinary:  NO CVA tenderness  External: no lesion Vagina: small amount of white discharge Cervix: pink, smooth, +CMT Uterus: NSSC Adnexa: NT   Neurological: She is alert and oriented to person, place, and time.  Skin: Skin is warm and dry.  Psychiatric: She has a normal mood and affect.    MAU Course  Procedures  Results for orders placed during the hospital encounter of 12/13/12 (from the past 24 hour(s))  URINALYSIS, ROUTINE W REFLEX MICROSCOPIC     Status: None   Collection Time    12/13/12 10:04 PM      Result Value Range   Color, Urine YELLOW  YELLOW   APPearance CLEAR  CLEAR   Specific Gravity, Urine 1.020  1.005 - 1.030   pH 6.5  5.0 - 8.0   Glucose, UA NEGATIVE  NEGATIVE mg/dL   Hgb urine dipstick NEGATIVE  NEGATIVE   Bilirubin Urine NEGATIVE  NEGATIVE   Ketones, ur NEGATIVE  NEGATIVE mg/dL   Protein, ur NEGATIVE  NEGATIVE mg/dL   Urobilinogen, UA 1.0  0.0 - 1.0 mg/dL   Nitrite NEGATIVE  NEGATIVE   Leukocytes, UA NEGATIVE  NEGATIVE  POCT PREGNANCY, URINE     Status: None   Collection Time    12/13/12 10:17 PM      Result Value Range   Preg Test, Ur NEGATIVE  NEGATIVE  CBC     Status: Abnormal   Collection Time    12/13/12 10:35 PM      Result Value Range   WBC 11.9 (*) 4.0 - 10.5 K/uL   RBC 4.07  3.87 - 5.11 MIL/uL   Hemoglobin 10.1 (*) 12.0 - 15.0 g/dL   HCT 16.1 (*) 09.6 - 04.5 %   MCV 78.9  78.0 - 100.0 fL   MCH  24.8 (*) 26.0 - 34.0 pg   MCHC 31.5  30.0 - 36.0 g/dL   RDW 40.9  81.1 - 91.4 %   Platelets 283  150 - 400 K/uL  COMPREHENSIVE METABOLIC PANEL     Status: Abnormal   Collection Time    12/13/12 10:35 PM      Result Value Range   Sodium 136  135 - 145 mEq/L   Potassium 3.7  3.5 - 5.1 mEq/L   Chloride 101  96 - 112 mEq/L   CO2 29  19 - 32 mEq/L   Glucose, Bld 110 (*) 70 - 99 mg/dL   BUN 10  6 - 23 mg/dL   Creatinine, Ser 7.82  0.50 - 1.10 mg/dL   Calcium 8.8  8.4 - 95.6 mg/dL   Total Protein 7.3  6.0 - 8.3 g/dL   Albumin 3.2 (*) 3.5 - 5.2 g/dL   AST 23  0 - 37 U/L   ALT 21  0 - 35 U/L   Alkaline Phosphatase 98  39 - 117 U/L   Total Bilirubin 0.3  0.3 - 1.2 mg/dL   GFR calc non Af Amer 68 (*) >90 mL/min   GFR calc Af Amer 79 (*) >90 mL/min   C/W Dr. Marice Potter. Will treat with 250mg  rocpehin IM and doxycycline X 10 days. FU in the clinic after abx are completed.   Assessment and Plan   1. Acute cervicitis    Treated with rocpehin here in MAU and first dose of doxycycline Will continue Doxycycline for 10 days FU with the clinic in 2 weeks Return to MAU if symptoms persist   Tawnya Crook 12/13/2012, 11:41 PM

## 2012-12-13 NOTE — MAU Note (Signed)
PT SAYS HER LMP- WAS 10-9  THRU 15TH.  THEN ON 10-17 - SHE HAD SPOTTING.      SAYS HER CYCLE IN   AUG AND SEPT HAS BEEN IRREG.    STARTED HAVING CRAMPS ON 10-8-   THOUGHT IT WAS CYCLE- TAKING TYLENOL--    MADE HER SLEEPY.  THEN ON 10-17- CRAMPING RETURNED .   NOW FEELS  MIDDLE OF ABD  CRAMPING- AND IF SHE LAYS ON HER RIGHT SIDE -  THAT HURTS.       SHE STARTED FEVER   AT 7PM  - TAMP 101.9-  TOOK   1000 MG TYLENOL  .  THEN AT  8PM- STILL HAD FEVER  AT 102.0.      HAS INCREASE IN FREQ TO VOID AND FEELS PRESSURE.      DENIES COUGH,  VOMITING, DIARRHEA,   CONGESTION.     DENIES ANY TRAVELLING-  BUT SAYS SHE  RIDES A CAB OFTEN AND  UNSURE IF ANY OF THOSE RIDERS  HAVE BEEN TO  [place].

## 2012-12-14 DIAGNOSIS — N72 Inflammatory disease of cervix uteri: Secondary | ICD-10-CM

## 2012-12-14 LAB — INFLUENZA PANEL BY PCR (TYPE A & B)
H1N1 flu by pcr: NOT DETECTED
Influenza A By PCR: NEGATIVE

## 2012-12-14 LAB — GC/CHLAMYDIA PROBE AMP: CT Probe RNA: POSITIVE — AB

## 2012-12-14 MED ORDER — DOXYCYCLINE HYCLATE 100 MG PO TABS: 100.0000 mg | ORAL_TABLET | Freq: Two times a day (BID) | ORAL | Status: DC

## 2012-12-14 MED ORDER — IBUPROFEN 800 MG PO TABS
800.0000 mg | ORAL_TABLET | Freq: Once | ORAL | Status: AC
  Administered 2012-12-14: 800 mg via ORAL
  Filled 2012-12-14: qty 1

## 2012-12-27 ENCOUNTER — Encounter: Payer: Medicaid Other | Admitting: Advanced Practice Midwife

## 2013-04-20 ENCOUNTER — Inpatient Hospital Stay (HOSPITAL_COMMUNITY)
Admission: AD | Admit: 2013-04-20 | Discharge: 2013-04-20 | Disposition: A | Discharge status: Home / Self Care | Payer: Medicaid Other | Source: Ambulatory Visit | Attending: Obstetrics & Gynecology | Admitting: Obstetrics & Gynecology

## 2013-04-20 ENCOUNTER — Inpatient Hospital Stay (HOSPITAL_COMMUNITY): Payer: Medicaid Other

## 2013-04-20 ENCOUNTER — Encounter (HOSPITAL_COMMUNITY): Payer: Self-pay

## 2013-04-20 DIAGNOSIS — F411 Generalized anxiety disorder: Secondary | ICD-10-CM | POA: Insufficient documentation

## 2013-04-20 DIAGNOSIS — F3289 Other specified depressive episodes: Secondary | ICD-10-CM | POA: Insufficient documentation

## 2013-04-20 DIAGNOSIS — R1031 Right lower quadrant pain: Secondary | ICD-10-CM | POA: Insufficient documentation

## 2013-04-20 DIAGNOSIS — O99330 Smoking (tobacco) complicating pregnancy, unspecified trimester: Secondary | ICD-10-CM | POA: Insufficient documentation

## 2013-04-20 DIAGNOSIS — B3731 Acute candidiasis of vulva and vagina: Secondary | ICD-10-CM | POA: Insufficient documentation

## 2013-04-20 DIAGNOSIS — F329 Major depressive disorder, single episode, unspecified: Secondary | ICD-10-CM | POA: Insufficient documentation

## 2013-04-20 DIAGNOSIS — M7918 Myalgia, other site: Secondary | ICD-10-CM

## 2013-04-20 DIAGNOSIS — B373 Candidiasis of vulva and vagina: Secondary | ICD-10-CM

## 2013-04-20 DIAGNOSIS — Z3490 Encounter for supervision of normal pregnancy, unspecified, unspecified trimester: Secondary | ICD-10-CM

## 2013-04-20 DIAGNOSIS — O2390 Unspecified genitourinary tract infection in pregnancy, unspecified trimester: Secondary | ICD-10-CM | POA: Insufficient documentation

## 2013-04-20 LAB — WET PREP, GENITAL
Clue Cells Wet Prep HPF POC: NONE SEEN
TRICH WET PREP: NONE SEEN

## 2013-04-20 LAB — POCT PREGNANCY, URINE: PREG TEST UR: POSITIVE — AB

## 2013-04-20 LAB — CBC
HEMATOCRIT: 35.2 % — AB (ref 36.0–46.0)
Hemoglobin: 11.5 g/dL — ABNORMAL LOW (ref 12.0–15.0)
MCH: 25.7 pg — ABNORMAL LOW (ref 26.0–34.0)
MCHC: 32.7 g/dL (ref 30.0–36.0)
MCV: 78.6 fL (ref 78.0–100.0)
Platelets: 213 10*3/uL (ref 150–400)
RBC: 4.48 MIL/uL (ref 3.87–5.11)
RDW: 16.3 % — AB (ref 11.5–15.5)
WBC: 15.2 10*3/uL — AB (ref 4.0–10.5)

## 2013-04-20 LAB — URINALYSIS, ROUTINE W REFLEX MICROSCOPIC
Bilirubin Urine: NEGATIVE
GLUCOSE, UA: NEGATIVE mg/dL
Hgb urine dipstick: NEGATIVE
KETONES UR: NEGATIVE mg/dL
Leukocytes, UA: NEGATIVE
Nitrite: NEGATIVE
PROTEIN: NEGATIVE mg/dL
Specific Gravity, Urine: 1.025 (ref 1.005–1.030)
Urobilinogen, UA: 0.2 mg/dL (ref 0.0–1.0)
pH: 6 (ref 5.0–8.0)

## 2013-04-20 MED ORDER — FLUCONAZOLE 150 MG PO TABS
150.0000 mg | ORAL_TABLET | Freq: Every day | ORAL | Status: DC
  Administered 2013-04-20: 150 mg via ORAL
  Filled 2013-04-20: qty 1

## 2013-04-20 NOTE — MAU Provider Note (Signed)
History     CSN: [id number]  Arrival date and time: 04/20/13 1412   First Provider Initiated Contact with Patient 04/20/13 1707      Chief Complaint  Patient presents with  . Possible Pregnancy  . Abdominal Pain  . Back Pain   HPI  Ms. Nicole Robinson is a 33 y.o. female [phone] at [redacted]w[redacted]d who presents with right lower quadrant pain and bilateral lower back pain.  Moving and repositioning make the pain worse. She has a history of ovarian cysts in the past and feels this pain is very similar. She denies abnormal discharge or vaginal bleeding. Pt is unsure of where she will have prenatal care " I still need to discuss this pregnancy with my significant other". She currently rates her pain 7.5/10.  Pt had a positive GC and Chlamydia swab back in Nov. And is unsure whether her boyfriend was treated.    OB History   Grav Para Term Preterm Abortions TAB SAB Ect Mult Living   5 2 1 1 2 2    2       Past Medical History  Diagnosis Date  . Anxiety   . Depression   . ADD (attention deficit disorder)   . Heart murmur   . Cyst of cervix   . Chlamydia   . Complication of anesthesia     Past Surgical History  Procedure Laterality Date  . Cesarean section      Family History  Problem Relation Age of Onset  . Diabetes Maternal Aunt   . Diabetes Maternal Grandmother     History  Substance Use Topics  . Smoking status: Current Every Day Smoker -- 0.50 packs/day  . Smokeless tobacco: Not on file  . Alcohol Use: 3.3 oz/week    3 Glasses of wine, 3 Drinks containing 0.5 oz of alcohol per week     Comment: occasional    Allergies:  Allergies  Allergen Reactions  . Vicodin [Hydrocodone-Acetaminophen] Hives and Rash  . Voltaren [Diclofenac Sodium] Hives and Rash    Taking with vicodin, not sure which is cause of allergy    Prescriptions prior to admission  Medication Sig Dispense Refill  . acetaminophen (TYLENOL) 500 MG tablet Take 1,000 mg by mouth every 6 (six) hours as  needed. For pain       Results for orders placed during the hospital encounter of 04/20/13 (from the past 48 hour(s))  URINALYSIS, ROUTINE W REFLEX MICROSCOPIC     Status: None   Collection Time    04/20/13  2:47 PM      Result Value Ref Range   Color, Urine YELLOW  YELLOW   APPearance CLEAR  CLEAR   Specific Gravity, Urine 1.025  1.005 - 1.030   pH 6.0  5.0 - 8.0   Glucose, UA NEGATIVE  NEGATIVE mg/dL   Hgb urine dipstick NEGATIVE  NEGATIVE   Bilirubin Urine NEGATIVE  NEGATIVE   Ketones, ur NEGATIVE  NEGATIVE mg/dL   Protein, ur NEGATIVE  NEGATIVE mg/dL   Urobilinogen, UA 0.2  0.0 - 1.0 mg/dL   Nitrite NEGATIVE  NEGATIVE   Leukocytes, UA NEGATIVE  NEGATIVE   Comment: MICROSCOPIC NOT DONE ON URINES WITH NEGATIVE PROTEIN, BLOOD, LEUKOCYTES, NITRITE, OR GLUCOSE <1000 mg/dL.  POCT PREGNANCY, URINE     Status: Abnormal   Collection Time    04/20/13  3:02 PM      Result Value Ref Range   Preg Test, Ur POSITIVE (*) NEGATIVE   Comment:  THE SENSITIVITY OF THIS     METHODOLOGY IS >24 mIU/mL  WET PREP, GENITAL     Status: Abnormal   Collection Time    04/20/13  5:21 PM      Result Value Ref Range   Yeast Wet Prep HPF POC FEW (*) NONE SEEN   Trich, Wet Prep NONE SEEN  NONE SEEN   Clue Cells Wet Prep HPF POC NONE SEEN  NONE SEEN   WBC, Wet Prep HPF POC MODERATE (*) NONE SEEN   Comment: MODERATE BACTERIA SEEN  CBC     Status: Abnormal   Collection Time    04/20/13  5:21 PM      Result Value Ref Range   WBC 15.2 (*) 4.0 - 10.5 K/uL   RBC 4.48  3.87 - 5.11 MIL/uL   Hemoglobin 11.5 (*) 12.0 - 15.0 g/dL   HCT 35.2 (*) 36.0 - 46.0 %   MCV 78.6  78.0 - 100.0 fL   MCH 25.7 (*) 26.0 - 34.0 pg   MCHC 32.7  30.0 - 36.0 g/dL   RDW 16.3 (*) 11.5 - 15.5 %   Platelets 213  150 - 400 K/uL   US Ob Comp Less 14 Wks  04/20/2013   CLINICAL DATA:  Right lower quadrant pain.  Positive pregnancy test.  EXAM: OBSTETRIC <14 WK ULTRASOUND  TECHNIQUE: Transabdominal ultrasound was performed  for evaluation of the gestation as well as the maternal uterus and adnexal regions.  COMPARISON:  None.  FINDINGS: Intrauterine gestational sac: Visualized/normal in shape.  Yolk sac:  Yes  Embryo:  Yes  Cardiac Activity: Yes  Heart Rate: 163 bpm  CRL:   12.5  mm   7 w 4 d                  [place] EDC: 12/03/2013  Maternal uterus/adnexae: No uterine mass. No evidence of a subchorionic hemorrhage. No adnexal masses or free fluid.  IMPRESSION: 1. Single live intrauterine pregnancy with a measured gestational age is 7 weeks and 4 days. 2. No emergent pregnancy complication. No emergent maternal finding. No findings to explain right lower quadrant pain.   Electronically Signed   By: Lajean Manes M.D.   On: 04/20/2013 18:16    Review of Systems  Constitutional: Positive for chills. Negative for fever.  Gastrointestinal: Positive for nausea, vomiting, abdominal pain (+Right lower quadrant pain ) and constipation. Negative for diarrhea.  Genitourinary: Negative for dysuria, urgency, frequency, hematuria and flank pain.       No vaginal discharge. No vaginal bleeding. No dysuria.   Musculoskeletal: Positive for back pain (+ both sides of her back ).   Physical Exam   Blood pressure 116/55, pulse 62, resp. rate 18, height 5' 3.5" (1.613 m), weight 91.264 kg (201 lb 3.2 oz), last menstrual period 02/25/2013, SpO2 100.00%.  Physical Exam  Constitutional: She is oriented to person, place, and time. She appears well-developed and well-nourished. No distress.  HENT:  Head: Normocephalic.  Eyes: Pupils are equal, round, and reactive to light.  Neck: Neck supple.  Respiratory: Effort normal.  GI: Soft. She exhibits no distension and no mass. There is tenderness (+ Right Lower Quadrant tenderness ) in the right lower quadrant, suprapubic area and left lower quadrant. There is no rigidity, no rebound, no guarding, no CVA tenderness and no tenderness at McBurney's point.  Genitourinary:  Speculum exam: Vagina -  Small amount of creamy discharge, no odor Cervix - No contact bleeding Bimanual exam: Cervix  closed, no CMT  Uterus non tender, gravid  Right adnexal tenderness,  no masses bilaterally GC/Chlam, wet prep done Chaperone present for exam.   Musculoskeletal: Normal range of motion.  Neurological: She is alert and oriented to person, place, and time.  Skin: Skin is warm. She is not diaphoretic.  Psychiatric: Her behavior is normal.    MAU Course  Procedures none  MDM UA Upt CBC [place] Wet prep GC- pending  Dr. Glo Herring at the bedside to see the patient  Likely right lower quadrant pain is musculoskeletal pain. Pt's N/V has been going on for weeks and likely related to early pregnancy N/V  Diflucan 150 mg given in MAU  Pt sitting up in the bed eating prior to discharge home.   Assessment and Plan   A:  Yeast vaginitis IUP Right lower quadrant pain in pregnancy   P:  Discharge home in stable condition  Ok to take tylenol as needed, as directed on the bottle.  Start prenatal care as soon as possible Return to MAU as needed, if symptoms worsen   Ronnald Ramp, NP  04/20/2013, 8:37 PM

## 2013-04-20 NOTE — MAU Note (Signed)
Patient is not in the lobby when called to triage.  

## 2013-04-20 NOTE — MAU Note (Signed)
Patient states  She started having abdominal and back pain at about 0200. Denies bleeding but has a slight discharge that is normal for her. Denies N/V/D or S/S of flu.

## 2013-04-21 LAB — GC/CHLAMYDIA PROBE AMP
CT Probe RNA: POSITIVE — AB
GC Probe RNA: NEGATIVE

## 2013-04-22 ENCOUNTER — Encounter: Payer: Self-pay | Admitting: Advanced Practice Midwife

## 2013-04-23 NOTE — MAU Provider Note (Signed)
`````  Attestation of Attending Supervision of Advanced Practitioner: Evaluation and management procedures were performed by the PA/NP/CNM/OB Fellow under my supervision/collaboration. Chart reviewed and agree with management and plan.  Jonnie Kind 04/23/2013 7:49 PM

## 2013-04-25 ENCOUNTER — Other Ambulatory Visit: Payer: Self-pay | Admitting: *Deleted

## 2013-04-25 MED ORDER — AZITHROMYCIN 250 MG PO TABS: ORAL_TABLET | ORAL | Status: DC

## 2013-04-25 NOTE — Progress Notes (Signed)
Pt was notified by Mellody Dance re: +Chlamydia test results and need for treatment with antibiotic.  Partner also requires treatment.

## 2013-06-01 ENCOUNTER — Emergency Department (HOSPITAL_COMMUNITY)
Admission: EM | Admit: 2013-06-01 | Discharge: 2013-06-01 | Disposition: A | Discharge status: Home / Self Care | Payer: Medicaid Other | Attending: Emergency Medicine | Admitting: Emergency Medicine

## 2013-06-01 ENCOUNTER — Encounter (HOSPITAL_COMMUNITY): Payer: Self-pay | Admitting: Emergency Medicine

## 2013-06-01 DIAGNOSIS — F17200 Nicotine dependence, unspecified, uncomplicated: Secondary | ICD-10-CM | POA: Insufficient documentation

## 2013-06-01 DIAGNOSIS — Z8742 Personal history of other diseases of the female genital tract: Secondary | ICD-10-CM | POA: Insufficient documentation

## 2013-06-01 DIAGNOSIS — Z8619 Personal history of other infectious and parasitic diseases: Secondary | ICD-10-CM | POA: Insufficient documentation

## 2013-06-01 DIAGNOSIS — K0889 Other specified disorders of teeth and supporting structures: Secondary | ICD-10-CM

## 2013-06-01 DIAGNOSIS — R011 Cardiac murmur, unspecified: Secondary | ICD-10-CM | POA: Insufficient documentation

## 2013-06-01 DIAGNOSIS — K089 Disorder of teeth and supporting structures, unspecified: Secondary | ICD-10-CM | POA: Insufficient documentation

## 2013-06-01 DIAGNOSIS — Z8659 Personal history of other mental and behavioral disorders: Secondary | ICD-10-CM | POA: Insufficient documentation

## 2013-06-01 MED ORDER — OXYCODONE-ACETAMINOPHEN 5-325 MG PO TABS
1.0000 | ORAL_TABLET | Freq: Once | ORAL | Status: AC
  Administered 2013-06-01: 1 via ORAL
  Filled 2013-06-01: qty 1

## 2013-06-01 MED ORDER — OXYCODONE-ACETAMINOPHEN 5-325 MG PO TABS: 2.0000 | ORAL_TABLET | Freq: Once | ORAL | Status: DC

## 2013-06-01 MED ORDER — PENICILLIN V POTASSIUM 500 MG PO TABS: 500.0000 mg | ORAL_TABLET | Freq: Three times a day (TID) | ORAL | Status: DC

## 2013-06-01 NOTE — ED Provider Notes (Signed)
CSN: [id number]     Arrival date & time 06/01/13  1611 History  This chart was scribed for non-physician practitioner Domenic Moras, PA-C working with Jasper Riling. Alvino Chapel, MD by Rolanda Lundborg, ED Scribe. This patient was seen in room WTR8/WTR8 and the patient's care was started at 5:26 PM.   Chief Complaint  Patient presents with  . Dental Pain   The history is provided by the patient. No language interpreter was used.   HPI Comments: Nicole Robinson is a 33 y.o. female who presents to the Emergency Department complaining of constant, gradually-worsening, sudden-onset right upper dental pain onset yesterday when she had a cold drink. She states the pain radiates into her face. The pain is shooting. She has a h/o dental abscesses and states this feels the same. She has had tooth extractions for abscesses twice in the past. She has been taking OTC tylenol and ibuprofen with no relief. She denies fevers or chills.    Past Medical History  Diagnosis Date  . Anxiety   . Depression   . ADD (attention deficit disorder)   . Heart murmur   . Cyst of cervix   . Chlamydia   . Complication of anesthesia    Past Surgical History  Procedure Laterality Date  . Cesarean section     Family History  Problem Relation Age of Onset  . Diabetes Maternal Aunt   . Diabetes Maternal Grandmother    History  Substance Use Topics  . Smoking status: Current Every Day Smoker -- 0.50 packs/day  . Smokeless tobacco: Not on file  . Alcohol Use: 3.3 oz/week    3 Glasses of wine, 3 Drinks containing 0.5 oz of alcohol per week     Comment: occasional   OB History   Grav Para Term Preterm Abortions TAB SAB Ect Mult Living   5 2 1 1 2 2    2      Review of Systems  Constitutional: Negative for fever and chills.  HENT: Positive for dental problem.       Allergies  Vicodin and Voltaren  Home Medications   Current Outpatient Rx  Name  Route  Sig  Dispense  Refill  . acetaminophen (TYLENOL) 500 MG  tablet   Oral   Take 1,000 mg by mouth every 6 (six) hours as needed. For pain         . ibuprofen (ADVIL,MOTRIN) 200 MG tablet   Oral   Take 800 mg by mouth every 6 (six) hours as needed for moderate pain.         Marland Kitchen oxyCODONE-acetaminophen (PERCOCET/ROXICET) 5-325 MG per tablet   Oral   Take 2 tablets by mouth once.          BP 128/84  Pulse 75  Temp(Src) 98.2 F (36.8 C) (Oral)  Resp 18  SpO2 99%  LMP 02/25/2013 Physical Exam  Nursing note and vitals reviewed. Constitutional: She is oriented to person, place, and time. She appears well-developed and well-nourished. No distress.  HENT:  Head: Normocephalic and atraumatic.  Tenderness on tooth number 2 without significant gingival decay. Without notable abscess for drainage. No trismus. No lymphadenopathy.   Eyes: EOM are normal.  Neck: Neck supple. No tracheal deviation present.  Cardiovascular: Normal rate.   Pulmonary/Chest: Effort normal. No respiratory distress.  Musculoskeletal: Normal range of motion.  Neurological: She is alert and oriented to person, place, and time.  Skin: Skin is warm and dry.  Psychiatric: She has a normal mood and affect.  Her behavior is normal.    ED Course  Procedures (including critical care time) Medications - No data to display  DIAGNOSTIC STUDIES: Oxygen Saturation is 99% on RA, normal by my interpretation.    COORDINATION OF CARE: 5:58 PM- Discussed treatment plan with pt which includes discharge home with percocet and penicillin. F/u with dentist. There is no notable abscess for drainage. Pt agrees to plan.    Labs Review Labs Reviewed - No data to display Imaging Review No results found.   EKG Interpretation None      MDM   Final diagnoses:  Pain, dental    BP 128/84  Pulse 75  Temp(Src) 98.2 F (36.8 C) (Oral)  Resp 18  SpO2 99%  LMP 02/25/2013   I personally performed the services described in this documentation, which was scribed in my presence.  The recorded information has been reviewed and is accurate.    Domenic Moras, PA-C 06/01/13 1818

## 2013-06-01 NOTE — Progress Notes (Signed)
   CARE MANAGEMENT ED NOTE 06/01/2013  Patient:  Nicole Robinson, Nicole Robinson   Account Number:  [id number]  Date Initiated:  06/01/2013  Documentation initiated by:  Livia Snellen  Subjective/Objective Assessment:   Patient presents to ED with dental pain.     Subjective/Objective Assessment Detail:     Action/Plan:   Action/Plan Detail:   Anticipated DC Date:  06/01/2013     Status Recommendation to Physician:   Result of Recommendation:    Other ED [place]  Other  PCP issues    Choice offered to / List presented to:            Status of service:  Completed, signed off  ED Comments:   ED Comments Detail:  EDCM spoke to patient at bedside.  Patient reports she does not have Medicaid insurance or a pcp living in [place].  [facility] provided patient with a list of pcps who accept self pay patients, lsit of discounted pharmacies and websites needymeds.org and GoodRX.com for medication assistance, fianncial resources in the community such as local churches and [employer], urban ministries, phone number to inquire about orange card, Medicaid and [employer] Act, and dental assistance for uninsured patients.  Patient thankful for resources.  No further EDCM needs at this time.

## 2013-06-01 NOTE — ED Notes (Signed)
Pt has a tooth that is causing her pain. Pt has a history of dental problems. Pt does not currently have a dentist.

## 2013-06-01 NOTE — ED Provider Notes (Signed)
Medical screening examination/treatment/procedure(s) were performed by non-physician practitioner and as supervising physician I was immediately available for consultation/collaboration.   EKG Interpretation None       Jasper Riling. Alvino Chapel, MD 06/01/13 [phone]

## 2013-06-01 NOTE — Discharge Instructions (Signed)

## 2013-07-16 ENCOUNTER — Encounter (HOSPITAL_COMMUNITY): Payer: Self-pay | Admitting: Emergency Medicine

## 2013-07-16 ENCOUNTER — Emergency Department (HOSPITAL_COMMUNITY)
Admission: EM | Admit: 2013-07-16 | Discharge: 2013-07-16 | Disposition: A | Discharge status: Home / Self Care | Payer: Medicaid Other | Attending: Emergency Medicine | Admitting: Emergency Medicine

## 2013-07-16 DIAGNOSIS — R112 Nausea with vomiting, unspecified: Secondary | ICD-10-CM | POA: Insufficient documentation

## 2013-07-16 DIAGNOSIS — Z3202 Encounter for pregnancy test, result negative: Secondary | ICD-10-CM | POA: Insufficient documentation

## 2013-07-16 DIAGNOSIS — F17200 Nicotine dependence, unspecified, uncomplicated: Secondary | ICD-10-CM | POA: Insufficient documentation

## 2013-07-16 DIAGNOSIS — Z8659 Personal history of other mental and behavioral disorders: Secondary | ICD-10-CM | POA: Insufficient documentation

## 2013-07-16 DIAGNOSIS — R197 Diarrhea, unspecified: Secondary | ICD-10-CM | POA: Insufficient documentation

## 2013-07-16 DIAGNOSIS — R011 Cardiac murmur, unspecified: Secondary | ICD-10-CM | POA: Insufficient documentation

## 2013-07-16 DIAGNOSIS — Z8742 Personal history of other diseases of the female genital tract: Secondary | ICD-10-CM | POA: Insufficient documentation

## 2013-07-16 DIAGNOSIS — Z8619 Personal history of other infectious and parasitic diseases: Secondary | ICD-10-CM | POA: Insufficient documentation

## 2013-07-16 LAB — URINALYSIS, ROUTINE W REFLEX MICROSCOPIC
Bilirubin Urine: NEGATIVE
GLUCOSE, UA: NEGATIVE mg/dL
Hgb urine dipstick: NEGATIVE
Ketones, ur: NEGATIVE mg/dL
LEUKOCYTES UA: NEGATIVE
NITRITE: NEGATIVE
Protein, ur: NEGATIVE mg/dL
Specific Gravity, Urine: 1.032 — ABNORMAL HIGH (ref 1.005–1.030)
Urobilinogen, UA: 1 mg/dL (ref 0.0–1.0)
pH: 5.5 (ref 5.0–8.0)

## 2013-07-16 LAB — PREGNANCY, URINE: PREG TEST UR: NEGATIVE

## 2013-07-16 MED ORDER — ONDANSETRON 4 MG PO TBDP
8.0000 mg | ORAL_TABLET | Freq: Once | ORAL | Status: AC
  Administered 2013-07-16: 8 mg via ORAL
  Filled 2013-07-16: qty 2

## 2013-07-16 MED ORDER — SODIUM CHLORIDE 0.9 % IV BOLUS (SEPSIS)
1000.0000 mL | Freq: Once | INTRAVENOUS | Status: AC
  Administered 2013-07-16: 1000 mL via INTRAVENOUS

## 2013-07-16 MED ORDER — ONDANSETRON 4 MG PO TBDP: 4.0000 mg | ORAL_TABLET | Freq: Three times a day (TID) | ORAL | Status: DC | PRN

## 2013-07-16 MED ORDER — ONDANSETRON HCL 4 MG/2ML IJ SOLN
4.0000 mg | Freq: Once | INTRAMUSCULAR | Status: AC
  Administered 2013-07-16: 4 mg via INTRAVENOUS
  Filled 2013-07-16: qty 2

## 2013-07-16 NOTE — Discharge Instructions (Signed)
Follow up with your primary care doctor about your hospital visit. Continue to hydrate orally.Take all medications as prescribed & use Zofran as directed for nausea & vomiting.  Read the instructions below for reasons to return to the ER.  ° °The 'BRAT' diet is suggested, then progress to diet as tolerated as symptoms abate. Call if bloody stools, persistent diarrhea, vomiting, fever or abdominal pain. °Bananas.  °Rice.  °Applesauce.  °Toast (and other simple starches such as crackers, potatoes, noodles).  ° °SEEK IMMEDIATE MEDICAL ATTENTION IF: ° °You begin having localized abdominal pain that does not go away or becomes severe (The right side could  possibly be appendicitis. In an adult, the left lower portion of the abdomen could be colitis or diverticulitis) °  °A temperature above 101 develops ° °Repeated vomiting occurs (multiple uncontrollable episodes) or you are unable to keep fluids down ° °Blood is being passed in stools or vomit (bright red or black tarry stools).  ° °Return also if you develop chest pain, difficulty breathing, dizziness or fainting, or become confused, poorly responsive, or inconsolable (young children). ° ° °RESOURCE GUIDE ° °Dental Problems ° °Patients with Medicaid: °[facility]                     [facility] °[address]                                           [address] °Phone:  [phone]                                                  Phone:  [phone] ° °If unable to pay or uninsured, contact:  [facility] or [facility] to become qualified for the adult dental clinic. ° °Chronic Pain Problems °Contact [facility]  297-2271 °Patients need to be referred by their primary care doctor. ° °Insufficient Money for Medicine °Contact [employer]:  call "211" or [facility] 271-5999. ° °No Primary Care Doctor °Call [facility]  832-8000 °Other agencies that provide inexpensive medical care °   [facility]  832-8035 °   [facility]  832-7272 °   [facility]  271-5999 °   [facility]  832-4777 °   [facility]  373-0678 °   [facility]  272-1050 ° °Psychological Services °[facility]  832-9600 °[facility]  378-7881 °[facility]   800 853-5163 (emergency services 641-4993) ° °Substance Abuse Resources °[facility]  336-882-2125 °[facility] 336-784-9470 °[facility] 336-285-9073 °[facility] 336-845-3988 °Residential & Outpatient Substance Abuse Program  800-659-3381 ° °Abuse/Neglect °[place] [facility] (336) 641-3795 °[place] [facility] 800-378-5315 (After Hours) ° °Emergency Shelter °[facility] (336) 271-5985 ° °Maternity Homes °[facility] (336) 275-9566 °[facility] (704) 372-4663 ° °MRSA Hotline #:   832-7006 ° ° ° °[place] [facility] ° °[facility] [facility]     [employer]                            [facility] [address] [place]                       [address]      [address]  [place]                                                [place]                            [place] Phone:  [phone]                                   Phone:  [phone]                 Phone:  [phone]  [facility] Phone:  342-8316  [place] [facility]line (336) 342-1394 (336) 342-3537 (After Hours)    

## 2013-07-16 NOTE — ED Notes (Signed)
Arrived with Child. N/v/d x last night. Loose to watery stools. Not able to keep solids down. Non-toxic appearance. ambulatory

## 2013-07-16 NOTE — ED Notes (Signed)
Pt states she feels much better. Keeping down PO fluids at the time. No active vomiting.

## 2013-07-16 NOTE — ED Provider Notes (Signed)
CSN: [id number]     Arrival date & time 07/16/13  4098 History   First MD Initiated Contact with Patient 07/16/13 1024     Chief Complaint  Patient presents with  . Emesis  . Diarrhea     (Consider location/radiation/quality/duration/timing/severity/associated sxs/prior Treatment) HPI Comments: Patient presents today with a chief complaint of nausea, vomiting, and diarrhea.  She reports that her symptoms began around 3 AM this morning.  She states that she has had two episodes of vomiting and 3-4 episodes of diarrhea.  No blood in her emesis or blood in her stool.  She has not taken anything for symptoms prior to arrival.  Her son has similar symptoms.  She denies abdominal pain, but does report abdominal pressure with vomiting.  She denies fever or chills.  Denies urinary symptoms.  No recent travel or unusual food intake.  Patient is a 33 y.o. female presenting with vomiting and diarrhea. The history is provided by the patient.  Emesis Associated symptoms: diarrhea   Diarrhea Associated symptoms: vomiting     Past Medical History  Diagnosis Date  . Anxiety   . Depression   . ADD (attention deficit disorder)   . Heart murmur   . Cyst of cervix   . Chlamydia   . Complication of anesthesia    Past Surgical History  Procedure Laterality Date  . Cesarean section     Family History  Problem Relation Age of Onset  . Diabetes Maternal Aunt   . Diabetes Maternal Grandmother    History  Substance Use Topics  . Smoking status: Current Every Day Smoker -- 0.50 packs/day  . Smokeless tobacco: Not on file  . Alcohol Use: 3.3 oz/week    3 Glasses of wine, 3 Drinks containing 0.5 oz of alcohol per week     Comment: occasional   OB History   Grav Para Term Preterm Abortions TAB SAB Ect Mult Living   5 2 1 1 2 2    2      Review of Systems  Gastrointestinal: Positive for vomiting and diarrhea.  All other systems reviewed and are negative.     Allergies  Vicodin and  Voltaren  Home Medications   Prior to Admission medications   Not on File   BP 118/76  Pulse 70  Temp(Src) 98.1 F (36.7 C) (Oral)  Resp 18  Wt 202 lb (91.627 kg)  SpO2 100%  LMP 02/25/2013 Physical Exam  Nursing note and vitals reviewed. Constitutional: She appears well-developed and well-nourished.  HENT:  Head: Normocephalic and atraumatic.  Mouth/Throat: Oropharynx is clear and moist.  Neck: Normal range of motion. Neck supple.  Cardiovascular: Normal rate, regular rhythm and normal heart sounds.   Pulmonary/Chest: Effort normal and breath sounds normal.  Abdominal: Soft. Normal appearance and bowel sounds are normal. She exhibits no distension and no mass. There is no tenderness. There is no rebound, no guarding and negative Murphy's sign.  Neurological: She is alert.  Skin: Skin is warm and dry.  Psychiatric: She has a normal mood and affect.    ED Course  Procedures (including critical care time) Labs Review Labs Reviewed  URINALYSIS, ROUTINE W REFLEX MICROSCOPIC - Abnormal; Notable for the following:    Specific Gravity, Urine 1.032 (*)    All other components within normal limits  PREGNANCY, URINE    Imaging Review No results found.   EKG Interpretation None     11:15 AM Reassessed patient.  She reports that her nausea has improved.  Abdomen soft and nontender.  Will fluid challenge and reassess. 12:00 PM Patient tolerating PO liquids.  MDM   Final diagnoses:  None   Patient presenting with nausea, vomiting, and diarrhea.  Son with similar symptoms.  Suspect Viral Gastroenteritis.  No abdominal tenderness to palpation.  Urine pregnancy negative.  Patient afebrile.  Patient given IVF and IV Zofran with improvement in symptoms.  Patient able to tolerate PO liquids prior to discharge.  Patient given Rx for Zofran.  Stable for discharge.  Return precautions given.    Hyman Bible, PA-C 07/16/13 1225

## 2013-07-16 NOTE — ED Notes (Signed)
Pt states nausea/vomiting and diarrhea. States son recently sick with same symptoms. States decreased PO intake. Denies pain at the time. Abdomen soft and non tender. Bowel sounds present all quadrants. Pt is alert and oriented x4. NAD.

## 2013-07-16 NOTE — ED Provider Notes (Signed)
Medical screening examination/treatment/procedure(s) were performed by non-physician practitioner and as supervising physician I was immediately available for consultation/collaboration.    Dot Lanes, MD 07/16/13 [phone]

## 2013-10-11 ENCOUNTER — Encounter (HOSPITAL_COMMUNITY): Payer: Self-pay | Admitting: Emergency Medicine

## 2013-10-11 ENCOUNTER — Emergency Department (HOSPITAL_COMMUNITY)
Admission: EM | Admit: 2013-10-11 | Discharge: 2013-10-11 | Disposition: A | Discharge status: Home / Self Care | Payer: MEDICAID | Attending: Emergency Medicine | Admitting: Emergency Medicine

## 2013-10-11 DIAGNOSIS — Z8619 Personal history of other infectious and parasitic diseases: Secondary | ICD-10-CM | POA: Insufficient documentation

## 2013-10-11 DIAGNOSIS — K0889 Other specified disorders of teeth and supporting structures: Secondary | ICD-10-CM

## 2013-10-11 DIAGNOSIS — R011 Cardiac murmur, unspecified: Secondary | ICD-10-CM | POA: Insufficient documentation

## 2013-10-11 DIAGNOSIS — F17200 Nicotine dependence, unspecified, uncomplicated: Secondary | ICD-10-CM | POA: Insufficient documentation

## 2013-10-11 DIAGNOSIS — Z3202 Encounter for pregnancy test, result negative: Secondary | ICD-10-CM | POA: Insufficient documentation

## 2013-10-11 DIAGNOSIS — Z792 Long term (current) use of antibiotics: Secondary | ICD-10-CM | POA: Insufficient documentation

## 2013-10-11 DIAGNOSIS — Z8742 Personal history of other diseases of the female genital tract: Secondary | ICD-10-CM | POA: Insufficient documentation

## 2013-10-11 DIAGNOSIS — K029 Dental caries, unspecified: Secondary | ICD-10-CM

## 2013-10-11 DIAGNOSIS — K089 Disorder of teeth and supporting structures, unspecified: Secondary | ICD-10-CM | POA: Insufficient documentation

## 2013-10-11 LAB — POC URINE PREG, ED: PREG TEST UR: NEGATIVE

## 2013-10-11 MED ORDER — PENICILLIN V POTASSIUM 250 MG PO TABS: 250.0000 mg | ORAL_TABLET | Freq: Four times a day (QID) | ORAL | Status: AC

## 2013-10-11 MED ORDER — OXYCODONE-ACETAMINOPHEN 5-325 MG PO TABS: 1.0000 | ORAL_TABLET | Freq: Three times a day (TID) | ORAL | Status: DC | PRN

## 2013-10-11 MED ORDER — OXYCODONE-ACETAMINOPHEN 5-325 MG PO TABS
1.0000 | ORAL_TABLET | Freq: Once | ORAL | Status: AC
  Administered 2013-10-11: 1 via ORAL
  Filled 2013-10-11: qty 1

## 2013-10-11 NOTE — Discharge Instructions (Signed)
Please call your doctor for a followup appointment within 24-48 hours. When you talk to your doctor please let them know that you were seen in the emergency department and have them acquire all of your records so that they can discuss the findings with you and formulate a treatment plan to fully care for your new and ongoing problems. Please call and set-up an appointment with dentist Please rest and stay hydrated  Please massage with heat Attached is the dental referrals please call  Please take medications as prescribed - while on pain medications there is to be no drinking alcohol, driving, operating any heavy machinery. If extra please dispose in a proper manner. Please do not take any extra Tylenol with this medication for this can lead to Tylenol overdose and liver issues.  Please continue to monitor symptoms closely and if symptoms are to worsen or change (fever greater than 101, chills, sweating, nausea, vomiting, chest pain, shortness of breathe, difficulty breathing, weakness, numbness, tingling, worsening or changes to pain pattern, swelling, throat closing sensation, neck pain, neck stiffness, inability to swallow) please report back to the Emergency Department immediately.    Dental Caries Dental caries (also called tooth decay) is the most common oral disease. It can occur at any age but is more common in children and young adults.  HOW DENTAL CARIES DEVELOPS  The process of decay begins when bacteria and foods (particularly sugars and starches) combine in your mouth to produce plaque. Plaque is a substance that sticks to the hard, outer surface of a tooth (enamel). The bacteria in plaque produce acids that attack enamel. These acids may also attack the root surface of a tooth (cementum) if it is exposed. Repeated attacks dissolve these surfaces and create holes in the tooth (cavities). If left untreated, the acids destroy the other layers of the tooth.  RISK FACTORS  Frequent sipping of  sugary beverages.   Frequent snacking on sugary and starchy foods, especially those that easily get stuck in the teeth.   Poor oral hygiene.   Dry mouth.   Substance abuse such as methamphetamine abuse.   Broken or poor-fitting dental restorations.   Eating disorders.   Gastroesophageal reflux disease (GERD).   Certain radiation treatments to the head and neck. SYMPTOMS In the early stages of dental caries, symptoms are seldom present. Sometimes white, chalky areas may be seen on the enamel or other tooth layers. In later stages, symptoms may include:  Pits and holes on the enamel.  Toothache after sweet, hot, or cold foods or drinks are consumed.  Pain around the tooth.  Swelling around the tooth. DIAGNOSIS  Most of the time, dental caries is detected during a regular dental checkup. A diagnosis is made after a thorough medical and dental history is taken and the surfaces of your teeth are checked for signs of dental caries. Sometimes special instruments, such as lasers, are used to check for dental caries. Dental X-ray exams may be taken so that areas not visible to the eye (such as between the contact areas of the teeth) can be checked for cavities.  TREATMENT  If dental caries is in its early stages, it may be reversed with a fluoride treatment or an application of a remineralizing agent at the dental office. Thorough brushing and flossing at home is needed to aid these treatments. If it is in its later stages, treatment depends on the location and extent of tooth destruction:   If a small area of the tooth has  been destroyed, the destroyed area will be removed and cavities will be filled with a material such as gold, silver amalgam, or composite resin.   If a large area of the tooth has been destroyed, the destroyed area will be removed and a cap (crown) will be fitted over the remaining tooth structure.   If the center part of the tooth (pulp) is affected, a  procedure called a root canal will be needed before a filling or crown can be placed.   If most of the tooth has been destroyed, the tooth may need to be pulled (extracted). HOME CARE INSTRUCTIONS You can prevent, stop, or reverse dental caries at home by practicing good oral hygiene. Good oral hygiene includes:  Thoroughly cleaning your teeth at least twice a day with a toothbrush and dental floss.   Using a fluoride toothpaste. A fluoride mouth rinse may also be used if recommended by your dentist or health care provider.   Restricting the amount of sugary and starchy foods and sugary liquids you consume.   Avoiding frequent snacking on these foods and sipping of these liquids.   Keeping regular visits with a dentist for checkups and cleanings. PREVENTION   Practice good oral hygiene.  Consider a dental sealant. A dental sealant is a coating material that is applied by your dentist to the pits and grooves of teeth. The sealant prevents food from being trapped in them. It may protect the teeth for several years.  Ask about fluoride supplements if you live in a community without fluorinated water or with water that has a low fluoride content. Use fluoride supplements as directed by your dentist or health care provider.  Allow fluoride varnish applications to teeth if directed by your dentist or health care provider. Document Released: 11/02/2001 Document Revised: 06/27/2013 Document Reviewed: 02/13/2012 [facility] Patient Information 2015 [place], [place]. This information is not intended to replace advice given to you by your health care provider. Make sure you discuss any questions you have with your health care provider.  Dental Pain A tooth ache may be caused by cavities (tooth decay). Cavities expose the nerve of the tooth to air and hot or cold temperatures. It may come from an infection or abscess (also called a boil or furuncle) around your tooth. It is also often caused by  dental caries (tooth decay). This causes the pain you are having. DIAGNOSIS  Your caregiver can diagnose this problem by exam. TREATMENT   If caused by an infection, it may be treated with medications which kill germs (antibiotics) and pain medications as prescribed by your caregiver. Take medications as directed.  Only take over-the-counter or prescription medicines for pain, discomfort, or fever as directed by your caregiver.  Whether the tooth ache today is caused by infection or dental disease, you should see your dentist as soon as possible for further care. SEEK MEDICAL CARE IF: The exam and treatment you received today has been provided on an emergency basis only. This is not a substitute for complete medical or dental care. If your problem worsens or new problems (symptoms) appear, and you are unable to meet with your dentist, call or return to this location. SEEK IMMEDIATE MEDICAL CARE IF:   You have a fever.  You develop redness and swelling of your face, jaw, or neck.  You are unable to open your mouth.  You have severe pain uncontrolled by pain medicine. MAKE SURE YOU:   Understand these instructions.  Will watch your condition.  Will  get help right away if you are not doing well or get worse. Document Released: 02/10/2005 Document Revised: 05/05/2011 Document Reviewed: 09/29/2007 [facility] Patient Information 2015 [place], [place]. This information is not intended to replace advice given to you by your health care provider. Make sure you discuss any questions you have with your health care provider.   Emergency Department Resource Guide 1) Find a Doctor and Pay Out of Pocket Although you won't have to find out who is covered by your insurance plan, it is a good idea to ask around and get recommendations. You will then need to call the office and see if the doctor you have chosen will accept you as a new patient and what types of options they offer for patients who are  self-pay. Some doctors offer discounts or will set up payment plans for their patients who do not have insurance, but you will need to ask so you aren't surprised when you get to your appointment.  2) Contact Your Local Health Department Not all health departments have doctors that can see patients for sick visits, but many do, so it is worth a call to see if yours does. If you don't know where your local health department is, you can check in your phone book. The CDC also has a tool to help you locate your state's health department, and many state websites also have listings of all of their local health departments.  3) Find a [facility] If your illness is not likely to be very severe or complicated, you may want to try a walk in clinic. These are popping up all over the country in pharmacies, drugstores, and shopping centers. They're usually staffed by nurse practitioners or physician assistants that have been trained to treat common illnesses and complaints. They're usually fairly quick and inexpensive. However, if you have serious medical issues or chronic medical problems, these are probably not your best option.  No Primary Care Doctor: - Call Health Connect at  [phone] - they can help you locate a primary care doctor that  accepts your insurance, provides certain services, etc. - Physician Referral Service- [phone]  Chronic Pain Problems: Organization         Address  Phone   Notes  [facility]  [phone] Patients need to be referred by their primary care doctor.   Medication Assistance: Organization         Address  Phone   Notes  [facility] Medication [facility] [address] [place], [place] [address] [phone] --Must be a resident of [facility] -- Must have NO insurance coverage whatsoever (no Medicaid/ Medicare, etc.) -- The pt. MUST have a primary care doctor that directs their care regularly and follows them in  the community   [facility]ist  [phone]   [employer]  [phone]    Agencies that provide inexpensive medical care: Organization         Address  Phone   Notes  [place]  [phone]   [facility]    [phone]   [facility] [place], [place] [address] [phone]   [place] [address], [place] [phone]   Planned Parenthood    [phone]   [facility]    [phone]   [place] and [place] [address], [place] Phone:  [phone], Fax:  [phone] Hours of Operation:  9 am - 6 pm, M-F.  Also accepts Medicaid/Medicare and self-pay.  [facility] [place], [address], [place] Phone: [phone], Fax: [phone]. Hours of Operation:  8:30 am - 5:30 pm, M-F.  Also accepts Medicaid and self-pay.  [facility] [place] [address] [place] Phone: [phone]   [place], [place], [place] [phone], Ext. [phone] Mondays & Thursdays: 7-9 AM.  First 15 patients are seen on a first come, first serve basis.    [place] Providers:  Organization         Address  Phone   Notes  [facility] [address], [place] [phone] Also accepts self-pay patients.  [facility] [address], [place]  [phone]   [place], [address], [place] [phone]   [facility] [address] [place] [phone]   Lucianne Lei [address], [place]   [phone] Only accepts [place] Access [place] patients after they have their name applied to their card.   Self-Pay (no insurance) in [facility]:  Organization         Address  Phone   Notes  Sickle Cell Patients,  [facility] [place] [phone]   [facility]gent Care [place] [phone]   [facility] [place]  [place], [place], [place] [phone]   [facility]/Dr. Osei-Bonsu  [address] [place] or [address], [place] [phone] Phone number for both [place] and [place] locations is the same.  Urgent Medical and [facility] [address] [place] [phone]   [facility] [address] [place] or [address] [phone]   [facility] [address] [place] [phone], phone; [phone], fax Sees patients 1st and 3rd Saturday of every month.  Must not qualify for public or private insurance (i.e. Medicaid, Medicare, [place] Health Choice, Veterans' Benefits)  Household income should be no more than 200% of the poverty level The clinic cannot treat you if you are pregnant or think you are pregnant  Sexually transmitted diseases are not treated at the clinic.    Dental Care: Organization         Address  Phone  Notes  [facility] Department of [facility] [place] [phone] Accepts children up to age 14 who are enrolled in [place] or [place]; pregnant women with a Medicaid card; and children who have applied for Medicaid or [place] Health Choice, but were declined, whose parents can pay a reduced fee at time of service.  [facility] Department of [facility]  [address], [place] [phone] Accepts children up to age 17 who are enrolled in [place] or [place]; pregnant women with a Medicaid card; and children who have applied for Medicaid or [place] Health Choice, but were declined, whose parents can pay a reduced fee at time of service.  [place] Adult Dental Access PROGRAM   [place] [phone] Patients are seen by appointment only. Walk-ins are not accepted. [place]  [facility]ental will see patients 84 years of age and older. Monday - Tuesday (8am-5pm) Most Wednesdays (8:30-5pm) $30 per visit, cash only  [facility] Adult [facility] Access PRO[facility]  [address], [facility] [phone] Patients are seen by appointment only. Walk-ins are not accepted. [place] will see patients 53 years of age and older. One Wednesday Evening (Monthly: Volunteer Based).  $30 per visit, cash only  [place]  [phone] for adults; Children under age 25, call Graduate Pediatric Dentistry at [phone]. Children aged 55-14, please call [phone] to request a pediatric application.  Dental services are provided in all areas of dental care including fillings, crowns and bridges, complete and partial dentures, implants, gum treatment, root canals, and extractions. Preventive care is also provided. Treatment is provided to both adults and children. Patients are selected via a lottery and there is often a waiting list.   [facility] [address] [place]  [phone] [email or web address]   [facility] [address] [place], [place] [phone], Ext. [phone] Second and Fourth Thursday of each month, opens at 6:30 AM; Clinic ends at 9 AM.  Patients are seen on a first-come first-served basis, and a limited number are seen during each clinic.   [facility]  [address] Hillard Danker [place], [place] [phone]   Eligibility Requirements You must have lived in [place], [place], or [place] counties for at least the last three months.   You cannot be eligible for state or federal sponsored [employer], including [employer], [place], or [employer].   You generally cannot be eligible for healthcare insurance through your employer.    How to apply: Eligibility screenings are  held every Tuesday and Wednesday afternoon from 1:00 pm until 4:00 pm. You do not need an appointment for the interview!  [facility] [address], [place], [place]   Advance  [place] Department  [place]  [phone]    Behavioral Health Resources in the Community: Intensive Outpatient Programs Organization         Address  Phone  Notes  [place]. [address] [place], [place] [phone]   [facility] Outpatient [address] [place], [place]   ADS: Alcohol & Drug S[facility] [address] [place], [place]   [place] [address]  [place], [place] or [phone]   Substance Abuse Resources Organization         Address  Phone  Notes  Alcohol and Drug Services  [phone]   [place]  [phone]   [facility] [place]   Chinita Pester  9780593912   Residential & Outpatient Substance Abuse Program  [phone]   Psychological Services Organization         Address  Phone  Notes  [facility]  [place]  [phone]   [place] [address] [place] or [phone]    Mobile Crisis Teams Organization         Address  Phone  Notes  [facility], Mobile Crisis Care Unit  [phone]   [facility]  [address] [place], [place]   [facility] [address] [place] [phone]    Self-Help/Support Groups Organization  Address  Phone             Notes  [place]. of [place] - variety of support groups  [place] Call for more information  Narcotics Anonymous (NA), [facility] [address], [employer]   2 meetings at this location     Special educational needs teacher         Address  Phone  Notes  [facility] Residential Treatment [place],    [place]  [phone]   [facility]  [address], [place] [id number], [place], [place]   [place], [place] [phone] Admissions: 8am-3pm M-F  [facility]cent[facility]stance [facility] [address],    [place], [place] [phone]   [facility] [address] [place], [place], [place]   [facility] [address]  [place], [place]   [facility] Programs - Intensive Outpatient [address], [place], [place]   [facility] ([place] [address].,  [place], [place] [phone] or [phone]   [facility] (RT[facility]) [address], [place], [place] Accepts Medicaid  [facility] [place] [address],  [place] [phone] Substance Abuse/Addiction Treatment   [facility] Organization         Address  Phone  Notes  [facility]  [phone]   Domenic Schwab, PhD [address] Arlis [place], [place]   [phone] or [phone]   [place], [place] [phone]   [facility] [address] [place], [place] [phone] Insurance/Medicaid/sponsorship through [facility] and Families [address] [place]                                    [place], [place] [phone] [place] [address], [place] [phone]    Dr. Adele Schilder  [phone]   [facility] Dept. 1) [address], [place] 2) [place] 3)  [place] [address], [place] [phone]  [phone]   [place] [phone] or [phone] (After  Hours)

## 2013-10-11 NOTE — ED Provider Notes (Signed)
CSN: [id number]     Arrival date & time 10/11/13  1737 History   First MD Initiated Contact with Patient 10/11/13 1813     This chart was scribed for non-physician practitioner, Jamse Mead PA-C working with Nicole Bucy, MD by Forrestine Him, ED Scribe. This patient was seen in room WTR5/WTR5 and the patient's care was started at 7:41 PM.   Chief Complaint  Patient presents with  . Dental Pain   The history is provided by the patient. No language interpreter was used.    HPI Comments: Nicole Robinson is a 33 y.o. female who presents to the Emergency Department complaining of constant, moderate L lower dental pain x  2-3 days that has progressively worsened. She describes pain as throbbing/shooting. Pt also reports associated L sided otalgia and hot/cold temperature sensitivities since onset of dental pain. She has tried OTC Tylenol and Ibuprofen without any improvement for symptoms. She attributes current acute pain to a history of a dental abscess to the same molar. States "half of the tooth is missing so I know the nerve is exposed". She denies any nausea, vomiting, fever, sore throat, difficulty swallowing, or chills. No neck pain or neck stiffness. She denies any drainage or bleeding. LNMP 10/08/2013. Pt with known allergies to Vicodin and Voltaren.  [facility]  Past Medical History  Diagnosis Date  . Anxiety   . Depression   . ADD (attention deficit disorder)   . Heart murmur   . Cyst of cervix   . Chlamydia   . Complication of anesthesia    Past Surgical History  Procedure Laterality Date  . Cesarean section     Family History  Problem Relation Age of Onset  . Diabetes Maternal Aunt   . Diabetes Maternal Grandmother    History  Substance Use Topics  . Smoking status: Current Every Day Smoker -- 0.50 packs/day  . Smokeless tobacco: Not on file  . Alcohol Use: 3.3 oz/week    3 Glasses of wine, 3 Drinks containing 0.5 oz of alcohol  per week     Comment: occasional   OB History   Grav Para Term Preterm Abortions TAB SAB Ect Mult Living   5 2 1 1 2 2    2      Review of Systems  Constitutional: Negative for fever and chills.  HENT: Positive for dental problem. Negative for sore throat and trouble swallowing.   Respiratory: Negative for chest tightness and shortness of breath.   Cardiovascular: Negative for chest pain.  Musculoskeletal: Negative for neck pain and neck stiffness.  Neurological: Negative for headaches.      Allergies  Vicodin and Voltaren  Home Medications   Prior to Admission medications   Medication Sig Start Date End Date Taking? Authorizing Provider  ondansetron (ZOFRAN ODT) 4 MG disintegrating tablet Take 1 tablet (4 mg total) by mouth every 8 (eight) hours as needed for nausea or vomiting. 07/16/13   Hyman Bible, PA-C  oxyCODONE-acetaminophen (PERCOCET/ROXICET) 5-325 MG per tablet Take 1 tablet by mouth every 8 (eight) hours as needed for moderate pain or severe pain. 10/11/13   Ezekeil Bethel, PA-C  penicillin v potassium (VEETID) 250 MG tablet Take 1 tablet (250 mg total) by mouth 4 (four) times daily. 10/11/13 10/18/13  Jarelyn Bambach, PA-C   Triage Vitals: BP 154/79  Pulse 60  Temp(Src) 98.5 F (36.9 C) (Oral)  Resp 16  SpO2 100%  LMP 10/05/2013  Breastfeeding? Unknown  Physical Exam  Nursing note and vitals reviewed. Constitutional: She is oriented to person, place, and time. She appears well-developed and well-nourished. No distress.  HENT:  Head: Normocephalic and atraumatic.  Mouth/Throat: She does not have dentures. No oral lesions. No trismus in the jaw. Normal dentition. Dental caries present. No dental abscesses or uvula swelling. No oropharyngeal exudate.    Poor dentition identified with numerous teeth diagrammed. Numerous dental caries identified. Second premolar of the left mandibular jawline identified to be chipped and decaying with turning brown color.  Negative drainable abscess noted. Negative trismus. Negative for lesions. Uvula midline with symmetrical elevation. Negative uvula swelling.  Eyes: Conjunctivae and EOM are normal. Pupils are equal, round, and reactive to light. Right eye exhibits no discharge. Left eye exhibits no discharge.  Neck: Normal range of motion. Neck supple. No tracheal deviation present.  Negative neck stiffness Negative nuchal rigidity  Negative cervical lymphadenopathy  Negative meningeal signs   Cardiovascular: Normal rate, regular rhythm and normal heart sounds.  Exam reveals no friction rub.   No murmur heard. Pulses:      Radial pulses are 2+ on the right side, and 2+ on the left side.  Pulmonary/Chest: Effort normal and breath sounds normal. No respiratory distress. She has no wheezes. She has no rales.  Patient is able to speak in full sentences without difficulty Negative use of accessory muscles Negative stridor  Musculoskeletal: Normal range of motion.  Full ROM to upper and lower extremities without difficulty noted, negative ataxia noted.  Lymphadenopathy:    She has no cervical adenopathy.  Neurological: She is alert and oriented to person, place, and time. No cranial nerve deficit. She exhibits normal muscle tone. Coordination normal.  Cranial nerves III-XII grossly intact   Skin: Skin is warm and dry. No rash noted. She is not diaphoretic. No erythema.  Psychiatric: She has a normal mood and affect. Her behavior is normal. Thought content normal.    ED Course  Procedures (including critical care time)  DIAGNOSTIC STUDIES: Oxygen Saturation is 100% on RA, Normal by my interpretation.    COORDINATION OF CARE: 7:41 PM-Discussed treatment plan with pt at bedside and pt agreed to plan.     Results for orders placed during the hospital encounter of 10/11/13  POC URINE PREG, ED      Result Value Ref Range   Preg Test, Ur NEGATIVE  NEGATIVE    Labs Review Labs Reviewed  POC URINE PREG,  ED    Imaging Review No results found.   EKG Interpretation None      MDM   Final diagnoses:  Pain, dental  Dental caries    Medications  oxyCODONE-acetaminophen (PERCOCET/ROXICET) 5-325 MG per tablet 1 tablet (not administered)    Filed Vitals:   10/11/13 1746  BP: 154/79  Pulse: 60  Temp: 98.5 F (36.9 C)  TempSrc: Oral  Resp: 16  SpO2: 100%   I personally performed the services described in this documentation, which was scribed in my presence. The recorded information has been reviewed and is accurate.  Doubt peritonsillar abscess. Doubt Ludwig's angina. Doubt retropharyngeal abscess. Negative drainable abscess identified on exam. Patient presenting to the ED with dental pain secondary to dental caries. Patient stable, afebrile. Patient not septic appearing. Discharged patient. Discharge patient with antibiotics and pain medications-discussed course, precautions, disposal technique. Discussed with patient to closely monitor symptoms and if symptoms are to worsen or change to report back to the ED - strict return instructions given.  Patient  agreed to plan of care, understood, all questions answered.   Jamse Mead, PA-C 10/11/13 2032

## 2013-10-11 NOTE — ED Notes (Signed)
Patient is alert and oriented x3.  She was given DC instructions and follow up visit instructions.  Patient gave verbal understanding. She was DC ambulatory under her own power to home.  V/S stable.  He was not showing any signs of distress on DC 

## 2013-10-11 NOTE — ED Notes (Signed)
Pt c/o left lower dental pain.

## 2013-10-12 NOTE — ED Provider Notes (Signed)
Medical screening examination/treatment/procedure(s) were performed by non-physician practitioner and as supervising physician I was immediately available for consultation/collaboration.   EKG Interpretation None        [name], MD 10/12/13 [phone]

## 2013-12-26 ENCOUNTER — Encounter (HOSPITAL_COMMUNITY): Payer: Self-pay | Admitting: Emergency Medicine

## 2014-02-04 ENCOUNTER — Emergency Department (HOSPITAL_COMMUNITY)
Admission: EM | Admit: 2014-02-04 | Discharge: 2014-02-05 | Disposition: A | Discharge status: Home / Self Care | Payer: MEDICAID | Attending: Emergency Medicine | Admitting: Emergency Medicine

## 2014-02-04 ENCOUNTER — Encounter (HOSPITAL_COMMUNITY): Payer: Self-pay | Admitting: Emergency Medicine

## 2014-02-04 DIAGNOSIS — Z8742 Personal history of other diseases of the female genital tract: Secondary | ICD-10-CM | POA: Insufficient documentation

## 2014-02-04 DIAGNOSIS — Z8659 Personal history of other mental and behavioral disorders: Secondary | ICD-10-CM | POA: Insufficient documentation

## 2014-02-04 DIAGNOSIS — K0889 Other specified disorders of teeth and supporting structures: Secondary | ICD-10-CM

## 2014-02-04 DIAGNOSIS — R011 Cardiac murmur, unspecified: Secondary | ICD-10-CM | POA: Insufficient documentation

## 2014-02-04 DIAGNOSIS — Z720 Tobacco use: Secondary | ICD-10-CM | POA: Insufficient documentation

## 2014-02-04 DIAGNOSIS — Z8619 Personal history of other infectious and parasitic diseases: Secondary | ICD-10-CM | POA: Insufficient documentation

## 2014-02-04 DIAGNOSIS — K088 Other specified disorders of teeth and supporting structures: Secondary | ICD-10-CM | POA: Insufficient documentation

## 2014-02-04 MED ORDER — HYDROMORPHONE HCL 1 MG/ML IJ SOLN
1.0000 mg | Freq: Once | INTRAMUSCULAR | Status: AC
  Administered 2014-02-05: 1 mg via INTRAMUSCULAR
  Filled 2014-02-04: qty 1

## 2014-02-04 MED ORDER — KETOROLAC TROMETHAMINE 30 MG/ML IJ SOLN
60.0000 mg | Freq: Once | INTRAMUSCULAR | Status: AC
  Administered 2014-02-05: 60 mg via INTRAMUSCULAR
  Filled 2014-02-04: qty 2

## 2014-02-04 NOTE — ED Notes (Signed)
Pt c/o L lower molar pain x 1 week, became severe tonight

## 2014-02-05 MED ORDER — IBUPROFEN 800 MG PO TABS: 800.0000 mg | ORAL_TABLET | Freq: Three times a day (TID) | ORAL | Status: DC | PRN

## 2014-02-05 MED ORDER — PERCOCET 5-325 MG PO TABS: 1.0000 | ORAL_TABLET | Freq: Four times a day (QID) | ORAL | Status: DC | PRN

## 2014-02-05 NOTE — Discharge Instructions (Signed)
Return here as needed. Follow up with the dentist provided. °

## 2014-02-05 NOTE — ED Provider Notes (Signed)
CSN: [id number]     Arrival date & time 02/04/14  2305 History   First MD Initiated Contact with Patient 02/04/14 2336     Chief Complaint  Patient presents with  . Dental Pain     (Consider location/radiation/quality/duration/timing/severity/associated sxs/prior Treatment) HPI Patient presents to the emergency department with dental pain that started earlier this evening.  The patient states that her left lower second molar became painful and she is having trouble controlling the pain at home.  The patient states that cold air and palpation make the pain worse.  The patient states that nothing seems to make her condition better.  The patient did not take any medications prior to arrival.  The patient denies nausea, vomiting, fever, neck swelling, throat swelling, weakness, dizziness, chest pain, shortness of breath or syncope.  The patient is very agitated and rolling around on the bed, screaming Past Medical History  Diagnosis Date  . Anxiety   . Depression   . ADD (attention deficit disorder)   . Heart murmur   . Cyst of cervix   . Chlamydia   . Complication of anesthesia    Past Surgical History  Procedure Laterality Date  . Cesarean section     Family History  Problem Relation Age of Onset  . Diabetes Maternal Aunt   . Diabetes Maternal Grandmother    History  Substance Use Topics  . Smoking status: Current Every Day Smoker -- 0.50 packs/day  . Smokeless tobacco: Not on file  . Alcohol Use: 3.3 oz/week    3 Glasses of wine, 3 Not specified per week     Comment: occasional   OB History    Gravida Para Term Preterm AB TAB SAB Ectopic Multiple Living   5 2 1 1 2 2    2      Review of Systems  All other systems negative except as documented in the HPI. All pertinent positives and negatives as reviewed in the HPI. Allergies  Voltaren  Home Medications   Prior to Admission medications   Medication Sig Start Date End Date Taking? Authorizing Provider  ondansetron  (ZOFRAN ODT) 4 MG disintegrating tablet Take 1 tablet (4 mg total) by mouth every 8 (eight) hours as needed for nausea or vomiting. 07/16/13   Hyman Bible, PA-C  oxyCODONE-acetaminophen (PERCOCET/ROXICET) 5-325 MG per tablet Take 1 tablet by mouth every 8 (eight) hours as needed for moderate pain or severe pain. 10/11/13   Marissa Sciacca, PA-C   BP 141/70 mmHg  Pulse 90  Temp(Src)   Resp 22  Ht 5\' 3"  (1.6 m)  Wt 191 lb (86.637 kg)  BMI 33.84 kg/m2  SpO2 96%  LMP 02/21/2013 Physical Exam  Constitutional: She is oriented to person, place, and time. She appears well-developed and well-nourished. No distress.  HENT:  Head: Normocephalic and atraumatic.  Mouth/Throat: Uvula is midline, oropharynx is clear and moist and mucous membranes are normal.    Eyes: Pupils are equal, round, and reactive to light.  Neck: Normal range of motion. Neck supple.  Cardiovascular: Normal rate, regular rhythm and normal heart sounds.  Exam reveals no gallop and no friction rub.   No murmur heard. Pulmonary/Chest: Effort normal and breath sounds normal. No respiratory distress.  Neurological: She is alert and oriented to person, place, and time. She exhibits normal muscle tone. Coordination normal.  Skin: Skin is warm and dry. No rash noted. No erythema.    ED Course  Procedures (including critical care time)  Patient will be  referred to dentistry.  Told to return here as needed MDM   Final diagnoses:  None       Brent General, PA-C 02/05/14 0009  Julianne Rice, MD 02/06/14 [phone]

## 2014-02-17 ENCOUNTER — Emergency Department (HOSPITAL_COMMUNITY): Payer: Medicaid Other

## 2014-02-17 ENCOUNTER — Encounter (HOSPITAL_COMMUNITY): Payer: Self-pay | Admitting: Emergency Medicine

## 2014-02-17 ENCOUNTER — Emergency Department (HOSPITAL_COMMUNITY)
Admission: EM | Admit: 2014-02-17 | Discharge: 2014-02-17 | Disposition: A | Discharge status: Home / Self Care | Payer: Medicaid Other | Attending: Emergency Medicine | Admitting: Emergency Medicine

## 2014-02-17 DIAGNOSIS — R05 Cough: Secondary | ICD-10-CM

## 2014-02-17 DIAGNOSIS — Z8659 Personal history of other mental and behavioral disorders: Secondary | ICD-10-CM | POA: Insufficient documentation

## 2014-02-17 DIAGNOSIS — Z79899 Other long term (current) drug therapy: Secondary | ICD-10-CM | POA: Insufficient documentation

## 2014-02-17 DIAGNOSIS — Z8619 Personal history of other infectious and parasitic diseases: Secondary | ICD-10-CM | POA: Insufficient documentation

## 2014-02-17 DIAGNOSIS — B9789 Other viral agents as the cause of diseases classified elsewhere: Secondary | ICD-10-CM

## 2014-02-17 DIAGNOSIS — J069 Acute upper respiratory infection, unspecified: Secondary | ICD-10-CM

## 2014-02-17 DIAGNOSIS — Z720 Tobacco use: Secondary | ICD-10-CM | POA: Insufficient documentation

## 2014-02-17 DIAGNOSIS — R011 Cardiac murmur, unspecified: Secondary | ICD-10-CM | POA: Insufficient documentation

## 2014-02-17 DIAGNOSIS — Z8742 Personal history of other diseases of the female genital tract: Secondary | ICD-10-CM | POA: Insufficient documentation

## 2014-02-17 DIAGNOSIS — R059 Cough, unspecified: Secondary | ICD-10-CM

## 2014-02-17 MED ORDER — HYDROCOD POLST-CHLORPHEN POLST 10-8 MG/5ML PO LQCR: 5.0000 mL | Freq: Two times a day (BID) | ORAL | Status: DC | PRN

## 2014-02-17 MED ORDER — IBUPROFEN 800 MG PO TABS: 800.0000 mg | ORAL_TABLET | Freq: Three times a day (TID) | ORAL | Status: DC

## 2014-02-17 MED ORDER — ALBUTEROL SULFATE HFA 108 (90 BASE) MCG/ACT IN AERS: 1.0000 | INHALATION_SPRAY | Freq: Four times a day (QID) | RESPIRATORY_TRACT | Status: DC | PRN

## 2014-02-17 MED ORDER — CETIRIZINE HCL 10 MG PO CAPS: 10.0000 mg | ORAL_CAPSULE | Freq: Every evening | ORAL | Status: DC | PRN

## 2014-02-17 NOTE — ED Notes (Signed)
Non-productive cough x 3 days. Pain in chest with coughing. Also, c/o nasal and sinus congestion. States ears are stuffy. Pt alert, no acute distress.

## 2014-02-17 NOTE — Discharge Instructions (Signed)
Upper Respiratory Infection, Adult An upper respiratory infection (URI) is also sometimes known as the common cold. The upper respiratory tract includes the nose, sinuses, throat, trachea, and bronchi. Bronchi are the airways leading to the lungs. Most people improve within 1 week, but symptoms can last up to 2 weeks. A residual cough may last even longer.  CAUSES Many different viruses can infect the tissues lining the upper respiratory tract. The tissues become irritated and inflamed and often become very moist. Mucus production is also common. A cold is contagious. You can easily spread the virus to others by oral contact. This includes kissing, sharing a glass, coughing, or sneezing. Touching your mouth or nose and then touching a surface, which is then touched by another person, can also spread the virus. SYMPTOMS  Symptoms typically develop 1 to 3 days after you come in contact with a cold virus. Symptoms vary from person to person. They may include:  Runny nose.  Sneezing.  Nasal congestion.  Sinus irritation.  Sore throat.  Loss of voice (laryngitis).  Cough.  Fatigue.  Muscle aches.  Loss of appetite.  Headache.  Low-grade fever. DIAGNOSIS  You might diagnose your own cold based on familiar symptoms, since most people get a cold 2 to 3 times a year. Your caregiver can confirm this based on your exam. Most importantly, your caregiver can check that your symptoms are not due to another disease such as strep throat, sinusitis, pneumonia, asthma, or epiglottitis. Blood tests, throat tests, and X-rays are not necessary to diagnose a common cold, but they may sometimes be helpful in excluding other more serious diseases. Your caregiver will decide if any further tests are required. RISKS AND COMPLICATIONS  You may be at risk for a more severe case of the common cold if you smoke cigarettes, have chronic heart disease (such as heart failure) or lung disease (such as asthma), or if  you have a weakened immune system. The very young and very old are also at risk for more serious infections. Bacterial sinusitis, middle ear infections, and bacterial pneumonia can complicate the common cold. The common cold can worsen asthma and chronic obstructive pulmonary disease (COPD). Sometimes, these complications can require emergency medical care and may be life-threatening. PREVENTION  The best way to protect against getting a cold is to practice good hygiene. Avoid oral or hand contact with people with cold symptoms. Wash your hands often if contact occurs. There is no clear evidence that vitamin C, vitamin E, echinacea, or exercise reduces the chance of developing a cold. However, it is always recommended to get plenty of rest and practice good nutrition. TREATMENT  Treatment is directed at relieving symptoms. There is no cure. Antibiotics are not effective, because the infection is caused by a virus, not by bacteria. Treatment may include:  Increased fluid intake. Sports drinks offer valuable electrolytes, sugars, and fluids.  Breathing heated mist or steam (vaporizer or shower).  Eating chicken soup or other clear broths, and maintaining good nutrition.  Getting plenty of rest.  Using gargles or lozenges for comfort.  Controlling fevers with ibuprofen or acetaminophen as directed by your caregiver.  Increasing usage of your inhaler if you have asthma. Zinc gel and zinc lozenges, taken in the first 24 hours of the common cold, can shorten the duration and lessen the severity of symptoms. Pain medicines may help with fever, muscle aches, and throat pain. A variety of non-prescription medicines are available to treat congestion and runny nose. Your caregiver  can make recommendations and may suggest nasal or lung inhalers for other symptoms.  HOME CARE INSTRUCTIONS   Only take over-the-counter or prescription medicines for pain, discomfort, or fever as directed by your  caregiver.  Use a warm mist humidifier or inhale steam from a shower to increase air moisture. This may keep secretions moist and make it easier to breathe.  Drink enough water and fluids to keep your urine clear or pale yellow.  Rest as needed.  Return to work when your temperature has returned to normal or as your caregiver advises. You may need to stay home longer to avoid infecting others. You can also use a face mask and careful hand washing to prevent spread of the virus. SEEK MEDICAL CARE IF:   After the first few days, you feel you are getting worse rather than better.  You need your caregiver's advice about medicines to control symptoms.  You develop chills, worsening shortness of breath, or brown or red sputum. These may be signs of pneumonia.  You develop yellow or brown nasal discharge or pain in the face, especially when you bend forward. These may be signs of sinusitis.  You develop a fever, swollen neck glands, pain with swallowing, or white areas in the back of your throat. These may be signs of strep throat. SEEK IMMEDIATE MEDICAL CARE IF:   You have a fever.  You develop severe or persistent headache, ear pain, sinus pain, or chest pain.  You develop wheezing, a prolonged cough, cough up blood, or have a change in your usual mucus (if you have chronic lung disease).  You develop sore muscles or a stiff neck. Document Released: 08/06/2000 Document Revised: 05/05/2011 Document Reviewed: 05/18/2013 [facility] Patient Information 2015 [place], [place]. This information is not intended to replace advice given to you by your health care provider. Make sure you discuss any questions you have with your health care provider.   Emergency Department Resource Guide 1) Find a Doctor and Pay Out of Pocket Although you won't have to find out who is covered by your insurance plan, it is a good idea to ask around and get recommendations. You will then need to call the office and see  if the doctor you have chosen will accept you as a new patient and what types of options they offer for patients who are self-pay. Some doctors offer discounts or will set up payment plans for their patients who do not have insurance, but you will need to ask so you aren't surprised when you get to your appointment.  2) Contact Your Local Health Department Not all health departments have doctors that can see patients for sick visits, but many do, so it is worth a call to see if yours does. If you don't know where your local health department is, you can check in your phone book. The CDC also has a tool to help you locate your state's health department, and many state websites also have listings of all of their local health departments.  3) Find a [facility] If your illness is not likely to be very severe or complicated, you may want to try a walk in clinic. These are popping up all over the country in pharmacies, drugstores, and shopping centers. They're usually staffed by nurse practitioners or physician assistants that have been trained to treat common illnesses and complaints. They're usually fairly quick and inexpensive. However, if you have serious medical issues or chronic medical problems, these are probably not your best  option.  No Primary Care Doctor: - Call Health Connect at  [phone] - they can help you locate a primary care doctor that  accepts your insurance, provides certain services, etc. - Physician Referral Service- [phone]  Chronic Pain Problems: Organization         Address  Phone   Notes  [facility]  [phone] Patients need to be referred by their primary care doctor.   Medication Assistance: Organization         Address  Phone   Notes  [facility] [address] [place], [place] [address] [phone] --Must be a resident of [facility] -- Must have NO insurance coverage whatsoever (no  Medicaid/ Medicare, etc.) -- The pt. MUST have a primary care doctor that directs their care regularly and follows them in the community   [facility]  [phone]   [employer]  [phone]    Agencies that provide inexpensive medical care: Organization         Address  Phone   Notes  [place]  [phone]   [facility]    [phone]   [facility] [place], [place] [address] [phone]   [place] [address] [place] [phone]   Planned Parenthood    [phone]   [facility]    [phone]   [place] and [address], [place] Phone:  [phone], Fax:  [phone] Hours of Operation:  9 am - 6 pm, M-F.  Also accepts Medicaid/Medicare and self-pay.  [facility] for [address], [address], [place] Phone: [phone], Fax: [phone]. Hours of Operation:  8:30 am - 5:30 pm, M-F.  Also accepts Medicaid and self-pay.  [facility] [place] [address] [place] Phone: [phone]   [place], [place], [place] [phone], Ext. [phone] Mondays & Thursdays: 7-9 AM.  First 15 patients are seen on a first come, first serve basis.    [place] Providers:  Organization         Address  Phone   Notes  [facility] [address], [place] [phone] Also accepts self-pay patients.  [facility] [id number] [place], [place]  [phone]   [place], [address], [place] [phone]   [facility] [address] [place] [phone]   Lucianne Lei [address], [place]   [phone] Only accepts [place] Access [place] patients after they have their name applied to their card.    Self-Pay (no insurance) in [facility]:  Organization         Address  Phone   Notes  Sickle Cell Patients, [facility] Internal Medicine [place] [phone]   [facility] [place] [phone]   [facility] [place]  [place], [place], [place] [phone]   [facility]/Dr. Osei-Bonsu  [address], [place] or [address], [place] [phone] Phone number for both [place] and [place] locations is the same.  Urgent Medical [facility] [address], [place] (  [place]) [phone]   [place], [place] or [address] [phone]   [facility] [address], [place] [phone], phone; [phone], fax Sees patients 1st and 3rd Saturday of every month.  Must not qualify for public or private insurance (i.e. Medicaid, Medicare, [place] Health Choice, Veterans' Benefits)  Household income should be no more than 200% of the poverty level The clinic cannot treat you if you are pregnant or think you are pregnant  Sexually transmitted diseases are not treated at the clinic.    Dental Care: Organization         Address  Phone  Notes  [facility] Department of [facility] [place] [phone] Accepts children up to age 44 who are enrolled in [place] or [place]; pregnant women with a Medicaid card; and children who have applied for Medicaid or [place] Health Choice, but were declined, whose parents can pay a reduced fee at time of service.  [facility] Department of [facility]  [address], [place] [phone] Accepts children up to age 48 who are enrolled in [place] or [place]; pregnant women with a Medicaid card; and children who have applied for Medicaid or [place] Health Choice,  but were declined, whose parents can pay a reduced fee at time of service.  [place] Adult Dental Access PROGRAM  [place] [phone] Patients are seen by appointment only. Walk-ins are not accepted. [place] will see patients 5 years of age and older. Monday - Tuesday (8am-5pm) Most Wednesdays (8:30-5pm) $30 per visit, cash only  [facility] Adult Dental Access PROGRAM  [address], [facility] [phone] Patients are seen by appointment only. Walk-ins are not accepted. [place] will see patients 31 years of age and older. One Wednesday Evening (Monthly: Volunteer Based).  $30 per visit, cash only  [place]  [phone] for adults; Children under age 14, call Graduate Pediatric Dentistry at [phone]. Children aged 36-14, please call [phone] to request a pediatric application.  Dental services are provided in all areas of dental care including fillings, crowns and bridges, complete and partial dentures, implants, gum treatment, root canals, and extractions. Preventive care is also provided. Treatment is provided to both adults and children. Patients are selected via a lottery and there is often a waiting list.   [facility] [address] [place]  [phone] [email or web address]   [facility] [address] [place], [place] [phone], Ext. [phone] Second and Fourth Thursday of each month, opens at 6:30 AM; Clinic ends at 9 AM.  Patients are seen on a first-come first-served basis, and a limited number are seen during each clinic.   [facility]  [address] Hillard Danker [place], [place] [phone]   Eligibility Requirements You must have lived in [place], [place], or [place] counties for at least the last three months.   You cannot be eligible for state or federal sponsored [employer], including [employer], [place], or [employer].   You generally  cannot be eligible for healthcare insurance through your employer.    How to apply: Eligibility screenings are held every Tuesday and Wednesday afternoon from 1:00 pm until 4:00 pm. You do not need an appointment for the interview!  [facility] [address], [place],  [place] Department  [place] Department  [place]  [phone]    Behavioral Health Resources in the Community: Intensive Outpatient Programs Organization         Address  Phone  Notes  [place]. [address] [place], [place] [phone]   [facility] Outpatient [address], [place], [place]   ADS: Alcohol & Drug Svcs [address], [place], [place]   [place] [address],  [place], [place] or [phone]   Substance Abuse Resources Organization         Address  Phone  Notes  Alcohol and Drug Services  [phone]   [place]  [phone]   [facility] [place]   Chinita Pester  [phone]   Residential & Outpatient Substance Abuse Program  [phone]   Psychological Services Organization         Address  Phone  Notes  [facility] [place]  [place]  [phone]   [place] [address] [place] or [phone]    Mobile Crisis Teams Organization         Address  Phone  Notes  [facility], Mobile Crisis Care Unit  [phone]   [facility]otherapeutic Services  [address] [place], [place]   [place] Levels [address] [place] [phone]    Self-Help/Support Groups Organization         Address  Phone             Notes  [place]. of [place] - variety of support groups  [place] Call for more  information  Narcotics Anonymous (NA), [facility] [address], [employer] [place]  2 meetings at this location   Special educational needs teacher         Address  Phone  Notes  [facility]idential Treatment [place],    [place]  [phone]   [facility]  [address] [place] [id number], [place], [place]   [place], [place] [phone] Admissions: 8am-3pm M-F  Incentives Substance [place] [address],    [place], [place] [phone]   [facility] [address] [place], [place], [place]   [facility] [address]  [place], [place]   [facility] Programs - Intensive Outpatient [address] Kristeen [address], [place], [place]   [facility] ([place].,  [place], [place] [phone] or [phone]   [facility] Services (RTS) [address], [place], [place] Accepts Medicaid  [facility] [place] [address]  [place] [phone] Substance Abuse/Addiction Treatment   [facility] Organization         Address  Phone  Notes  [facility]  [phone]   Domenic Schwab, PhD [address] Arlis Porta [place], [place]   [phone] or [phone]   [place] Iv[place]e [place], [place] [phone]   [place] [address], [place], [place] [phone] Insurance/Medicaid/sponsorship through [employer] and Families [address], [id number]  [place], [place] [phone] Therapy/tele-psych/case  °[facility] [address]  ° [place], [place] [phone]    °Dr. Arfeen  [phone]   °[facility] [place]  [employer] [place] [facility] 1) [address], [place] °2) [address], [place] °3)  [address], [place] [phone] °([phone] ° °([phone]   °[place] Child Abuse Hotline (336) 342-1394 or ([phone] (After Hours)    ° ° °

## 2014-02-17 NOTE — ED Provider Notes (Signed)
CSN: [id number]     Arrival date & time 02/17/14  2113 History  This chart was scribed for non-physician practitioner Starlyn Skeans, PA-C working with Dorie Rank, MD by Rayfield Citizen, ED Scribe. This patient was seen in room WTR5/WTR5 and the patient's care was started at 9:58 PM.   Chief Complaint  Patient presents with  . Cough   The history is provided by the patient. No language interpreter was used.     HPI Comments: Nonna Renninger is an otherwise healthy 33 y.o. female who presents to the Emergency Department complaining of 4 days of nonproductive cough with associated chest discomfort and congestion (nasal, sinus, ears). Patient reports the sensation of a sharp object stuck in her throat - she had one  episode of post-tussive vomiting. Patient has been treating her symptoms with left over pain meds (Norco) with no relief. She denies fevers, chills, nausea, abdominal pain, diarrhea, constipation, rashes. Patient reports one sick contact with similar symptoms.   Patient does not have a PCP at this time.   Past Medical History  Diagnosis Date  . Anxiety   . Depression   . ADD (attention deficit disorder)   . Heart murmur   . Cyst of cervix   . Chlamydia   . Complication of anesthesia    Past Surgical History  Procedure Laterality Date  . Cesarean section     Family History  Problem Relation Age of Onset  . Diabetes Maternal Aunt   . Diabetes Maternal Grandmother    History  Substance Use Topics  . Smoking status: Current Every Day Smoker -- 0.50 packs/day  . Smokeless tobacco: Not on file  . Alcohol Use: 3.3 oz/week    3 Glasses of wine, 3 Not specified per week     Comment: occasional   OB History    Gravida Para Term Preterm AB TAB SAB Ectopic Multiple Living   5 2 1 1 2 2    2      Review of Systems  HENT: Positive for congestion.   Respiratory: Positive for cough.        Chest discomfort with cough  All other systems reviewed and are  negative.  Allergies  Voltaren  Home Medications   Prior to Admission medications   Medication Sig Start Date End Date Taking? Authorizing Provider  albuterol (PROVENTIL HFA;VENTOLIN HFA) 108 (90 BASE) MCG/ACT inhaler Inhale 1-2 puffs into the lungs every 6 (six) hours as needed for wheezing or shortness of breath. 02/17/14   Dannae Kato A Forcucci, PA-C  Cetirizine HCl (ZYRTEC ALLERGY) 10 MG CAPS Take 1 capsule (10 mg total) by mouth at bedtime as needed (Congestion). 02/17/14   Kaylina Cahue A Forcucci, PA-C  chlorpheniramine-HYDROcodone (TUSSIONEX PENNKINETIC ER) 10-8 MG/5ML LQCR Take 5 mLs by mouth every 12 (twelve) hours as needed for cough. 02/17/14   Mamoru Takeshita A Forcucci, PA-C  ibuprofen (ADVIL,MOTRIN) 800 MG tablet Take 1 tablet (800 mg total) by mouth 3 (three) times daily. 02/17/14   Dalen [place] A Forcucci, PA-C   BP 116/65 mmHg  Pulse 74  Temp(Src) 99.2 F (37.3 C) (Oral)  Resp 16  SpO2 100%  LMP 02/07/2013 Physical Exam  Constitutional: She is oriented to person, place, and time. She appears well-developed.  HENT:  Head: Normocephalic.  Right Ear: Hearing, tympanic membrane, external ear and ear canal normal.  Left Ear: Hearing, tympanic membrane, external ear and ear canal normal.  Mouth/Throat: Oropharynx is clear and moist. No oropharyngeal exudate.  Mucosal edema in nose  Eyes:  Conjunctivae and EOM are normal. Pupils are equal, round, and reactive to light. No scleral icterus.  Neck: Neck supple. No thyromegaly present.  Cardiovascular: Normal rate, regular rhythm, normal heart sounds and intact distal pulses.  Exam reveals no gallop and no friction rub.   No murmur heard. Pulmonary/Chest: Effort normal and breath sounds normal. No stridor. No respiratory distress. She has no wheezes. She has no rales. She exhibits tenderness.  Abdominal: Soft. Bowel sounds are normal. She exhibits no distension and no mass. There is no tenderness. There is no rebound and no guarding.   Musculoskeletal: Normal range of motion. She exhibits no edema.  Lymphadenopathy:    She has no cervical adenopathy.  Neurological: She is oriented to person, place, and time. She exhibits normal muscle tone. Coordination normal.  Skin: No rash noted. No erythema.  Psychiatric: She has a normal mood and affect. Her behavior is normal.  Nursing note and vitals reviewed.   ED Course  Procedures   DIAGNOSTIC STUDIES: Oxygen Saturation is 100% on RA, normal by my interpretation.    COORDINATION OF CARE: 10:06 PM Discussed treatment plan with pt at bedside and pt agreed to plan.   Labs Review Labs Reviewed - No data to display  Imaging Review Dg Chest 2 View  02/17/2014   CLINICAL DATA:  Mild productive cough for 3 days  EXAM: CHEST  2 VIEW  COMPARISON:  Radiograph 06/22/2011  FINDINGS: Normal mediastinum and cardiac silhouette. Normal pulmonary vasculature. No evidence of effusion, infiltrate, or pneumothorax. No acute bony abnormality.  IMPRESSION: Normal chest radiograph.   Electronically Signed   By: Suzy Bouchard M.D.   On: 02/17/2014 22:50     EKG Interpretation None      MDM   Final diagnoses:  Cough  Viral upper respiratory tract infection with cough   Patient is a 33 year old female who presents emergency room for evaluation of cold symptoms. Physical exam reveals clear lung sounds. Vital signs are stable. Chest x-ray is negative. Suspect that this is likely viral upper respiratory infection. Will treat symptomatically with albuterol, tussionex, nasal saline, and Zyrtec. [facility]. Patient to return for symptoms of pneumonia. She states understanding and agreement at this time. Patient is stable for discharge.  I personally performed the services described in this documentation, which was scribed in my presence. The recorded information has been reviewed and is accurate.      Cherylann Parr,  PA-C 02/17/14 3817  Everlene Balls, MD 02/18/14 1345

## 2014-03-28 ENCOUNTER — Emergency Department (HOSPITAL_COMMUNITY)
Admission: EM | Admit: 2014-03-28 | Discharge: 2014-03-28 | Disposition: A | Discharge status: Home / Self Care | Payer: MEDICAID | Attending: Emergency Medicine | Admitting: Emergency Medicine

## 2014-03-28 ENCOUNTER — Encounter (HOSPITAL_COMMUNITY): Payer: Self-pay

## 2014-03-28 ENCOUNTER — Emergency Department (HOSPITAL_COMMUNITY): Payer: MEDICAID

## 2014-03-28 DIAGNOSIS — R011 Cardiac murmur, unspecified: Secondary | ICD-10-CM | POA: Insufficient documentation

## 2014-03-28 DIAGNOSIS — J9801 Acute bronchospasm: Secondary | ICD-10-CM | POA: Insufficient documentation

## 2014-03-28 DIAGNOSIS — Z8742 Personal history of other diseases of the female genital tract: Secondary | ICD-10-CM | POA: Insufficient documentation

## 2014-03-28 DIAGNOSIS — Z8659 Personal history of other mental and behavioral disorders: Secondary | ICD-10-CM | POA: Insufficient documentation

## 2014-03-28 DIAGNOSIS — Z791 Long term (current) use of non-steroidal anti-inflammatories (NSAID): Secondary | ICD-10-CM | POA: Insufficient documentation

## 2014-03-28 DIAGNOSIS — Z720 Tobacco use: Secondary | ICD-10-CM | POA: Insufficient documentation

## 2014-03-28 DIAGNOSIS — Z8619 Personal history of other infectious and parasitic diseases: Secondary | ICD-10-CM | POA: Insufficient documentation

## 2014-03-28 MED ORDER — PREDNISONE 20 MG PO TABS
60.0000 mg | ORAL_TABLET | Freq: Once | ORAL | Status: AC
  Administered 2014-03-28: 60 mg via ORAL
  Filled 2014-03-28: qty 3

## 2014-03-28 MED ORDER — IPRATROPIUM-ALBUTEROL 0.5-2.5 (3) MG/3ML IN SOLN
3.0000 mL | Freq: Once | RESPIRATORY_TRACT | Status: AC
  Administered 2014-03-28: 3 mL via RESPIRATORY_TRACT
  Filled 2014-03-28: qty 3

## 2014-03-28 MED ORDER — PREDNISONE 20 MG PO TABS: 40.0000 mg | ORAL_TABLET | Freq: Every day | ORAL | Status: DC

## 2014-03-28 MED ORDER — ALBUTEROL SULFATE (2.5 MG/3ML) 0.083% IN NEBU
5.0000 mg | INHALATION_SOLUTION | Freq: Once | RESPIRATORY_TRACT | Status: AC
  Administered 2014-03-28: 5 mg via RESPIRATORY_TRACT
  Filled 2014-03-28: qty 6

## 2014-03-28 MED ORDER — ALBUTEROL SULFATE HFA 108 (90 BASE) MCG/ACT IN AERS
2.0000 | INHALATION_SPRAY | RESPIRATORY_TRACT | Status: DC | PRN
  Filled 2014-03-28: qty 6.7

## 2014-03-28 MED ORDER — IPRATROPIUM BROMIDE 0.02 % IN SOLN
0.5000 mg | Freq: Once | RESPIRATORY_TRACT | Status: AC
  Administered 2014-03-28: 0.5 mg via RESPIRATORY_TRACT
  Filled 2014-03-28: qty 2.5

## 2014-03-28 NOTE — ED Notes (Signed)
Pt states cough with shortness of breath x 1-2 days. Pt loosing her breath with coughing.  No fever.

## 2014-03-28 NOTE — ED Provider Notes (Addendum)
CSN: [id number]     Arrival date & time 03/28/14  1133 History   First MD Initiated Contact with Patient 03/28/14 1146     Chief Complaint  Patient presents with  . Shortness of Breath  . Cough     (Consider location/radiation/quality/duration/timing/severity/associated sxs/prior Treatment) Patient is a 34 y.o. female presenting with shortness of breath and cough. The history is provided by the patient.  Shortness of Breath Severity:  Moderate Onset quality:  Gradual Duration:  2 days Timing:  Constant Progression:  Worsening Chronicity:  Recurrent Context: weather changes   Context: not occupational exposure and not URI   Relieved by:  Nothing Worsened by:  Activity and coughing Ineffective treatments:  Rest Associated symptoms: chest pain, cough and wheezing   Associated symptoms: no fever, no sore throat, no sputum production and no vomiting   Associated symptoms comment:  Chest pain only with coughing Risk factors: tobacco use   Cough Associated symptoms: chest pain, shortness of breath and wheezing   Associated symptoms: no fever and no sore throat     Past Medical History  Diagnosis Date  . Anxiety   . Depression   . ADD (attention deficit disorder)   . Heart murmur   . Cyst of cervix   . Chlamydia   . Complication of anesthesia    Past Surgical History  Procedure Laterality Date  . Cesarean section     Family History  Problem Relation Age of Onset  . Diabetes Maternal Aunt   . Diabetes Maternal Grandmother    History  Substance Use Topics  . Smoking status: Current Every Day Smoker -- 0.50 packs/day  . Smokeless tobacco: Not on file  . Alcohol Use: 3.3 oz/week    3 Glasses of wine, 3 Not specified per week     Comment: occasional   OB History    Gravida Para Term Preterm AB TAB SAB Ectopic Multiple Living   5 2 1 1 2 2    2      Review of Systems  Constitutional: Negative for fever.  HENT: Negative for sore throat.   Respiratory: Positive for  cough, shortness of breath and wheezing. Negative for sputum production.   Cardiovascular: Positive for chest pain.  Gastrointestinal: Negative for vomiting.  All other systems reviewed and are negative.     Allergies  Voltaren  Home Medications   Prior to Admission medications   Medication Sig Start Date End Date Taking? Authorizing Provider  albuterol (PROVENTIL HFA;VENTOLIN HFA) 108 (90 BASE) MCG/ACT inhaler Inhale 1-2 puffs into the lungs every 6 (six) hours as needed for wheezing or shortness of breath. 02/17/14   Courtney A Forcucci, PA-C  Cetirizine HCl (ZYRTEC ALLERGY) 10 MG CAPS Take 1 capsule (10 mg total) by mouth at bedtime as needed (Congestion). 02/17/14   Courtney A Forcucci, PA-C  chlorpheniramine-HYDROcodone (TUSSIONEX PENNKINETIC ER) 10-8 MG/5ML LQCR Take 5 mLs by mouth every 12 (twelve) hours as needed for cough. 02/17/14   Courtney A Forcucci, PA-C  ibuprofen (ADVIL,MOTRIN) 800 MG tablet Take 1 tablet (800 mg total) by mouth 3 (three) times daily. 02/17/14   Courtney A Forcucci, PA-C   BP 116/52 mmHg  Pulse 71  Temp(Src) 98.5 F (36.9 C) (Oral)  Resp 32  SpO2 97%  LMP 03/19/2014 Physical Exam  Constitutional: She is oriented to person, place, and time. She appears well-developed and well-nourished. No distress.  HENT:  Head: Normocephalic and atraumatic.  Right Ear: Tympanic membrane normal.  Left Ear: Tympanic membrane  normal.  Mouth/Throat: Oropharynx is clear and moist.  Eyes: Conjunctivae and EOM are normal. Pupils are equal, round, and reactive to light.  Neck: Normal range of motion. Neck supple.  Cardiovascular: Normal rate, regular rhythm and intact distal pulses.   No murmur heard. Pulmonary/Chest: Effort normal. Tachypnea noted. No respiratory distress. She has decreased breath sounds. She has no wheezes. She has no rales.  Coughing on exam  Abdominal: Soft. She exhibits no distension. There is no tenderness. There is no rebound and no guarding.   Musculoskeletal: Normal range of motion. She exhibits no edema or tenderness.  Neurological: She is alert and oriented to person, place, and time.  Skin: Skin is warm and dry. No rash noted. No erythema.  Psychiatric: She has a normal mood and affect. Her behavior is normal.  Nursing note and vitals reviewed.   ED Course  Procedures (including critical care time) Labs Review Labs Reviewed - No data to display  Imaging Review Dg Chest 2 View  03/28/2014   CLINICAL DATA:  Smoker, cough, congestion no fever  EXAM: CHEST  2 VIEW  COMPARISON:  02/17/2014  FINDINGS: Cardiomediastinal silhouette is stable. No acute infiltrate or pleural effusion. No pulmonary edema. Central mild bronchitic changes. Bony thorax is unremarkable.  IMPRESSION: No acute infiltrate or pulmonary edema. Central mild bronchitic changes.   Electronically Signed   By: Lahoma Crocker M.D.   On: 03/28/2014 12:30     EKG Interpretation None      MDM   Final diagnoses:  Bronchospasm    Patient who is a smoker and presents with worsening cough, shortness of breath over the last 2 days without URI symptoms. She states approximately one month ago she was seen for similar and at that time was given an inhaler. She unfortunately was unable to fill the prescriptions because they were too expensive. However symptoms resolved spontaneously. Approximate 2 days ago symptoms restarted. On exam she is tachypnea with significant decreased air movement. Patient given albuterol and Atrovent. She denies any infectious symptoms.  2:09 PM Breath sounds improved after nebs and steroids.  Also patient has been vaccinated against pertussis in the last 5 years. Will d/c home with same and f/u with PCP or return here if sx worsen.  Blanchie Dessert, MD 03/28/14 [place], MD 03/28/14 [phone]

## 2014-03-28 NOTE — ED Notes (Signed)
Pt reports relief. Lungs sounds clear on assessment post treatment. Pt talking on  cellphone in complete sentences without difficulty/SOB/labored breathing.

## 2014-03-28 NOTE — Discharge Instructions (Signed)
Bronchospasm °A bronchospasm is a spasm or tightening of the airways going into the lungs. During a bronchospasm breathing becomes more difficult because the airways get smaller. When this happens there can be coughing, a whistling sound when breathing (wheezing), and difficulty breathing. Bronchospasm is often associated with asthma, but not all patients who experience a bronchospasm have asthma. °CAUSES  °A bronchospasm is caused by inflammation or irritation of the airways. The inflammation or irritation may be triggered by:  °· Allergies (such as to animals, pollen, food, or mold). Allergens that cause bronchospasm may cause wheezing immediately after exposure or many hours later.   °· Infection. Viral infections are believed to be the most common cause of bronchospasm.   °· Exercise.   °· Irritants (such as pollution, cigarette smoke, strong odors, aerosol sprays, and paint fumes).   °· Weather changes. Winds increase molds and pollens in the air. Rain refreshes the air by washing irritants out. Cold air may cause inflammation.   °· Stress and emotional upset.   °SIGNS AND SYMPTOMS  °· Wheezing.   °· Excessive nighttime coughing.   °· Frequent or severe coughing with a simple cold.   °· Chest tightness.   °· Shortness of breath.   °DIAGNOSIS  °Bronchospasm is usually diagnosed through a history and physical exam. Tests, such as chest X-rays, are sometimes done to look for other conditions. °TREATMENT  °· Inhaled medicines can be given to open up your airways and help you breathe. The medicines can be given using either an inhaler or a nebulizer machine. °· Corticosteroid medicines may be given for severe bronchospasm, usually when it is associated with asthma. °HOME CARE INSTRUCTIONS  °· Always have a plan prepared for seeking medical care. Know when to call your health care provider and local emergency services (911 in the [place] Know where you can access local emergency care. °· Only take medicines as  directed by your health care provider. °· If you were prescribed an inhaler or nebulizer machine, ask your health care provider to explain how to use it correctly. Always use a spacer with your inhaler if you were given one. °· It is necessary to remain calm during an attack. Try to relax and breathe more slowly.  °· Control your home environment in the following ways:   °¨ Change your heating and air conditioning filter at least once a month.   °¨ Limit your use of fireplaces and wood stoves. °¨ Do not smoke and do not allow smoking in your home.   °¨ Avoid exposure to perfumes and fragrances.   °¨ Get rid of pests (such as roaches and mice) and their droppings.   °¨ Throw away plants if you see mold on them.   °¨ Keep your house clean and dust free.   °¨ Replace carpet with wood, tile, or vinyl flooring. Carpet can trap dander and dust.   °¨ Use allergy-proof pillows, mattress covers, and box spring covers.   °¨ Wash bed sheets and blankets every week in hot water and dry them in a dryer.   °¨ Use blankets that are made of polyester or cotton.   °¨ Wash hands frequently. °SEEK MEDICAL CARE IF:  °· You have muscle aches.   °· You have chest pain.   °· The sputum changes from clear or white to yellow, green, gray, or bloody.   °· The sputum you cough up gets thicker.   °· There are problems that may be related to the medicine you are given, such as a rash, itching, swelling, or trouble breathing.   °SEEK IMMEDIATE MEDICAL CARE IF:  °· You have worsening wheezing and coughing even   after taking your prescribed medicines.   °· You have increased difficulty breathing.   °· You develop severe chest pain. °MAKE SURE YOU:  °· Understand these instructions. °· Will watch your condition. °· Will get help right away if you are not doing well or get worse. °Document Released: 02/13/2003 Document Revised: 02/15/2013 Document Reviewed: 08/02/2012 °[employer]® Patient Information ©2015 [employer]. This information is not  intended to replace advice given to you by your health care provider. Make sure you discuss any questions you have with your health care provider. ° °

## 2014-09-15 ENCOUNTER — Emergency Department (HOSPITAL_COMMUNITY)
Admission: EM | Admit: 2014-09-15 | Discharge: 2014-09-15 | Disposition: A | Discharge status: Home / Self Care | Payer: Medicaid Other | Attending: Emergency Medicine | Admitting: Emergency Medicine

## 2014-09-15 ENCOUNTER — Encounter (HOSPITAL_COMMUNITY): Payer: Self-pay

## 2014-09-15 DIAGNOSIS — Z79899 Other long term (current) drug therapy: Secondary | ICD-10-CM | POA: Insufficient documentation

## 2014-09-15 DIAGNOSIS — Z8619 Personal history of other infectious and parasitic diseases: Secondary | ICD-10-CM | POA: Insufficient documentation

## 2014-09-15 DIAGNOSIS — F419 Anxiety disorder, unspecified: Secondary | ICD-10-CM

## 2014-09-15 DIAGNOSIS — R011 Cardiac murmur, unspecified: Secondary | ICD-10-CM | POA: Insufficient documentation

## 2014-09-15 DIAGNOSIS — Z8742 Personal history of other diseases of the female genital tract: Secondary | ICD-10-CM | POA: Insufficient documentation

## 2014-09-15 DIAGNOSIS — F329 Major depressive disorder, single episode, unspecified: Secondary | ICD-10-CM | POA: Insufficient documentation

## 2014-09-15 DIAGNOSIS — R51 Headache: Secondary | ICD-10-CM | POA: Insufficient documentation

## 2014-09-15 DIAGNOSIS — Z720 Tobacco use: Secondary | ICD-10-CM | POA: Insufficient documentation

## 2014-09-15 DIAGNOSIS — R519 Headache, unspecified: Secondary | ICD-10-CM

## 2014-09-15 DIAGNOSIS — Z3202 Encounter for pregnancy test, result negative: Secondary | ICD-10-CM | POA: Insufficient documentation

## 2014-09-15 LAB — COMPREHENSIVE METABOLIC PANEL
ALT: 29 U/L (ref 14–54)
AST: 51 U/L — AB (ref 15–41)
Albumin: 4.5 g/dL (ref 3.5–5.0)
Alkaline Phosphatase: 67 U/L (ref 38–126)
Anion gap: 11 (ref 5–15)
BUN: 6 mg/dL (ref 6–20)
CALCIUM: 9.1 mg/dL (ref 8.9–10.3)
CO2: 28 mmol/L (ref 22–32)
Chloride: 99 mmol/L — ABNORMAL LOW (ref 101–111)
Creatinine, Ser: 0.86 mg/dL (ref 0.44–1.00)
GLUCOSE: 88 mg/dL (ref 65–99)
Potassium: 2.7 mmol/L — CL (ref 3.5–5.1)
Sodium: 138 mmol/L (ref 135–145)
Total Bilirubin: 0.7 mg/dL (ref 0.3–1.2)
Total Protein: 8.2 g/dL — ABNORMAL HIGH (ref 6.5–8.1)

## 2014-09-15 LAB — PREGNANCY, URINE: Preg Test, Ur: NEGATIVE

## 2014-09-15 LAB — URINALYSIS, ROUTINE W REFLEX MICROSCOPIC
Bilirubin Urine: NEGATIVE
Glucose, UA: NEGATIVE mg/dL
HGB URINE DIPSTICK: NEGATIVE
KETONES UR: 40 mg/dL — AB
Leukocytes, UA: NEGATIVE
Nitrite: NEGATIVE
PH: 6 (ref 5.0–8.0)
Protein, ur: NEGATIVE mg/dL
Specific Gravity, Urine: 1.02 (ref 1.005–1.030)
Urobilinogen, UA: 1 mg/dL (ref 0.0–1.0)

## 2014-09-15 LAB — CBC WITH DIFFERENTIAL/PLATELET
BASOS PCT: 0 % (ref 0–1)
Basophils Absolute: 0 10*3/uL (ref 0.0–0.1)
EOS PCT: 0 % (ref 0–5)
Eosinophils Absolute: 0 10*3/uL (ref 0.0–0.7)
HEMATOCRIT: 42.5 % (ref 36.0–46.0)
HEMOGLOBIN: 13.7 g/dL (ref 12.0–15.0)
LYMPHS ABS: 1.5 10*3/uL (ref 0.7–4.0)
LYMPHS PCT: 11 % — AB (ref 12–46)
MCH: 25.9 pg — ABNORMAL LOW (ref 26.0–34.0)
MCHC: 32.2 g/dL (ref 30.0–36.0)
MCV: 80.5 fL (ref 78.0–100.0)
Monocytes Absolute: 1.6 10*3/uL — ABNORMAL HIGH (ref 0.1–1.0)
Monocytes Relative: 11 % (ref 3–12)
Neutro Abs: 11.3 10*3/uL — ABNORMAL HIGH (ref 1.7–7.7)
Neutrophils Relative %: 78 % — ABNORMAL HIGH (ref 43–77)
Platelets: 245 10*3/uL (ref 150–400)
RBC: 5.28 MIL/uL — ABNORMAL HIGH (ref 3.87–5.11)
RDW: 14.3 % (ref 11.5–15.5)
WBC: 14.5 10*3/uL — ABNORMAL HIGH (ref 4.0–10.5)

## 2014-09-15 LAB — LIPASE, BLOOD: Lipase: 11 U/L — ABNORMAL LOW (ref 22–51)

## 2014-09-15 MED ORDER — LORAZEPAM 2 MG/ML IJ SOLN
1.0000 mg | Freq: Once | INTRAMUSCULAR | Status: AC
  Administered 2014-09-15: 1 mg via INTRAVENOUS
  Filled 2014-09-15: qty 1

## 2014-09-15 MED ORDER — POTASSIUM CHLORIDE CRYS ER 20 MEQ PO TBCR
40.0000 meq | EXTENDED_RELEASE_TABLET | Freq: Once | ORAL | Status: AC
  Administered 2014-09-15: 40 meq via ORAL
  Filled 2014-09-15: qty 2

## 2014-09-15 MED ORDER — IBUPROFEN 800 MG PO TABS
800.0000 mg | ORAL_TABLET | Freq: Once | ORAL | Status: AC
  Administered 2014-09-15: 800 mg via ORAL
  Filled 2014-09-15: qty 1

## 2014-09-15 MED ORDER — HYDROMORPHONE HCL 1 MG/ML IJ SOLN
0.5000 mg | Freq: Once | INTRAMUSCULAR | Status: DC
  Filled 2014-09-15: qty 1

## 2014-09-15 MED ORDER — LORAZEPAM 0.5 MG PO TABS: 0.5000 mg | ORAL_TABLET | Freq: Three times a day (TID) | ORAL | Status: DC | PRN

## 2014-09-15 NOTE — ED Notes (Signed)
MD Zammit at bedside. 

## 2014-09-15 NOTE — ED Notes (Signed)
AVS explained in detail, knows to follow up with PCP next week. Ambulatory to wheelchair. Pt is NOT driving home. Given specific instructions of use of Ativan. No other c/c.

## 2014-09-15 NOTE — Discharge Instructions (Signed)
Follow up with your family md next week. °

## 2014-09-15 NOTE — ED Notes (Signed)
Pt cannot use restroom at this time, aware specimen is needed. 

## 2014-09-15 NOTE — ED Notes (Addendum)
Upon entering room patient is pacing the floor and waving arms around while texting on her phone. When asked if she was still feeling anxious she says, "Mm I'm just feeling jittery, I don't know." Pt not directly answering questions at this time. Unable to give exact pain score.  Upon re-entering room patient is snoring and lying asleep on her right side. Arouses easily with vocal stimuli. Given 800 mg Ibuprofen-says she takes this medication at home and requested this medication specifically. Will re-assess promptly. MD Zammit aware of behavior.

## 2014-09-15 NOTE — ED Provider Notes (Signed)
CSN: [id number]     Arrival date & time 09/15/14  1029 History   First MD Initiated Contact with Patient 09/15/14 1048     Chief Complaint  Patient presents with  . Migraine  . Anxiety     (Consider location/radiation/quality/duration/timing/severity/associated sxs/prior Treatment) Patient is a 34 y.o. female presenting with migraines and anxiety. The history is provided by the patient (the pt complains of anxiety and a headache).  Migraine This is a recurrent problem. The current episode started 12 to 24 hours ago. The problem occurs constantly. The problem has not changed since onset.Pertinent negatives include no chest pain, no abdominal pain and no headaches. Nothing aggravates the symptoms. Nothing relieves the symptoms.  Anxiety Pertinent negatives include no chest pain, no abdominal pain and no headaches.    Past Medical History  Diagnosis Date  . Anxiety   . Depression   . ADD (attention deficit disorder)   . Heart murmur   . Cyst of cervix   . Chlamydia   . Complication of anesthesia    Past Surgical History  Procedure Laterality Date  . Cesarean section     Family History  Problem Relation Age of Onset  . Diabetes Maternal Aunt   . Diabetes Maternal Grandmother    History  Substance Use Topics  . Smoking status: Current Every Day Smoker -- 0.50 packs/day  . Smokeless tobacco: Not on file  . Alcohol Use: 3.3 oz/week    3 Glasses of wine, 3 Standard drinks or equivalent per week     Comment: occasional   OB History    Gravida Para Term Preterm AB TAB SAB Ectopic Multiple Living   5 2 1 1 2 2    2      Review of Systems  Constitutional: Negative for appetite change and fatigue.  HENT: Negative for congestion, ear discharge and sinus pressure.        Headache  Eyes: Negative for discharge.  Respiratory: Negative for cough.   Cardiovascular: Negative for chest pain.  Gastrointestinal: Negative for abdominal pain and diarrhea.  Genitourinary: Negative for  frequency and hematuria.  Musculoskeletal: Negative for back pain.  Skin: Negative for rash.  Neurological: Negative for seizures and headaches.  Psychiatric/Behavioral: Positive for agitation. Negative for hallucinations.      Allergies  Voltaren  Home Medications   Prior to Admission medications   Medication Sig Start Date End Date Taking? Authorizing Provider  acetaminophen (TYLENOL) 500 MG tablet Take 1,000 mg by mouth every 6 (six) hours as needed for mild pain or headache.   Yes Historical Provider, MD  ibuprofen (ADVIL,MOTRIN) 200 MG tablet Take 400 mg by mouth every 6 (six) hours as needed for fever or moderate pain.   Yes Historical Provider, MD  albuterol (PROVENTIL HFA;VENTOLIN HFA) 108 (90 BASE) MCG/ACT inhaler Inhale 1-2 puffs into the lungs every 6 (six) hours as needed for wheezing or shortness of breath. Patient not taking: Reported on 03/28/2014 02/17/14   Starlyn Skeans, PA-C  Cetirizine HCl (ZYRTEC ALLERGY) 10 MG CAPS Take 1 capsule (10 mg total) by mouth at bedtime as needed (Congestion). Patient not taking: Reported on 03/28/2014 02/17/14   Starlyn Skeans, PA-C  chlorpheniramine-HYDROcodone (TUSSIONEX PENNKINETIC ER) 10-8 MG/5ML LQCR Take 5 mLs by mouth every 12 (twelve) hours as needed for cough. Patient not taking: Reported on 03/28/2014 02/17/14   Starlyn Skeans, PA-C  ibuprofen (ADVIL,MOTRIN) 800 MG tablet Take 1 tablet (800 mg total) by mouth 3 (three) times daily. Patient not taking: Reported  on 03/28/2014 02/17/14   Loma Sousa Forcucci, PA-C  LORazepam (ATIVAN) 0.5 MG tablet Take 1 tablet (0.5 mg total) by mouth every 8 (eight) hours as needed for anxiety. 09/15/14   Milton Ferguson, MD  predniSONE (DELTASONE) 20 MG tablet Take 2 tablets (40 mg total) by mouth daily. Patient not taking: Reported on 09/15/2014 03/28/14   Blanchie Dessert, MD   BP 124/72 mmHg  Pulse 75  Temp(Src) 99.5 F (37.5 C) (Oral)  Resp 16  Ht 5' 3.5" (1.613 m)  Wt 191 lb (86.637 kg)  BMI  33.30 kg/m2  SpO2 99%  LMP 08/14/2014 Physical Exam  Constitutional: She is oriented to person, place, and time. She appears well-developed.  HENT:  Head: Normocephalic.  Eyes: Conjunctivae and EOM are normal. No scleral icterus.  Neck: Neck supple. No thyromegaly present.  Cardiovascular: Normal rate and regular rhythm.  Exam reveals no gallop and no friction rub.   No murmur heard. Pulmonary/Chest: No stridor. She has no wheezes. She has no rales. She exhibits no tenderness.  Abdominal: She exhibits no distension. There is no tenderness. There is no rebound.  Musculoskeletal: Normal range of motion. She exhibits no edema.  Lymphadenopathy:    She has no cervical adenopathy.  Neurological: She is oriented to person, place, and time. She exhibits normal muscle tone. Coordination normal.  Pt very anxious  Skin: No rash noted. No erythema.  Psychiatric: She has a normal mood and affect. Her behavior is normal.    ED Course  Procedures (including critical care time) Labs Review Labs Reviewed  CBC WITH DIFFERENTIAL/PLATELET - Abnormal; Notable for the following:    WBC 14.5 (*)    RBC 5.28 (*)    MCH 25.9 (*)    Neutrophils Relative % 78 (*)    Neutro Abs 11.3 (*)    Lymphocytes Relative 11 (*)    Monocytes Absolute 1.6 (*)    All other components within normal limits  COMPREHENSIVE METABOLIC PANEL - Abnormal; Notable for the following:    Potassium 2.7 (*)    Chloride 99 (*)    Total Protein 8.2 (*)    AST 51 (*)    All other components within normal limits  URINALYSIS, ROUTINE W REFLEX MICROSCOPIC (NOT AT [facility]) - Abnormal; Notable for the following:    Color, Urine AMBER (*)    Ketones, ur 40 (*)    All other components within normal limits  LIPASE, BLOOD - Abnormal; Notable for the following:    Lipase 11 (*)    All other components within normal limits  PREGNANCY, URINE    Imaging Review No results found.   EKG Interpretation None    pt took motrin for  headache and ativan for anxiety,  Pts headache improved and she became much more calm  MDM   Final diagnoses:  Anxiety  Headache behind the eyes    Headache and anxiety, labs unremarkable except K and mild elevated wbc.   Most likely caused by stress.  Pt usually improves with ativan.  Will rx some ativan and have her follow up with pcp   Milton Ferguson, MD 09/15/14 [phone]

## 2014-09-15 NOTE — ED Notes (Signed)
Patient c/o migraine headache and anxiety since last night. Patient denies any N/V, dizziness.

## 2014-09-29 ENCOUNTER — Emergency Department (HOSPITAL_COMMUNITY)
Admission: EM | Admit: 2014-09-29 | Discharge: 2014-09-30 | Disposition: A | Discharge status: Home / Self Care | Payer: Medicaid Other | Attending: Emergency Medicine | Admitting: Emergency Medicine

## 2014-09-29 ENCOUNTER — Encounter (HOSPITAL_COMMUNITY): Payer: Self-pay | Admitting: Emergency Medicine

## 2014-09-29 DIAGNOSIS — Z8619 Personal history of other infectious and parasitic diseases: Secondary | ICD-10-CM | POA: Insufficient documentation

## 2014-09-29 DIAGNOSIS — Z720 Tobacco use: Secondary | ICD-10-CM | POA: Insufficient documentation

## 2014-09-29 DIAGNOSIS — G478 Other sleep disorders: Secondary | ICD-10-CM | POA: Insufficient documentation

## 2014-09-29 DIAGNOSIS — R011 Cardiac murmur, unspecified: Secondary | ICD-10-CM | POA: Diagnosis not present

## 2014-09-29 DIAGNOSIS — Z79899 Other long term (current) drug therapy: Secondary | ICD-10-CM | POA: Diagnosis not present

## 2014-09-29 DIAGNOSIS — R000 Tachycardia, unspecified: Secondary | ICD-10-CM | POA: Diagnosis not present

## 2014-09-29 DIAGNOSIS — F419 Anxiety disorder, unspecified: Secondary | ICD-10-CM | POA: Insufficient documentation

## 2014-09-29 DIAGNOSIS — Z8742 Personal history of other diseases of the female genital tract: Secondary | ICD-10-CM | POA: Insufficient documentation

## 2014-09-29 NOTE — ED Notes (Addendum)
Pt from home c/o feeling jittery she believes it may be related to the lorazepam she began taking on 7/22. She was DX with anxiety on 7/22. She reports shortness of breath that comes and goes and sometimes her eye go crossed. She reports a higher stress level at home. Denies SI or HI. Lat dose of lorazepam was last night. She reports she is out of medication but reports that is not why she is here.

## 2014-09-30 MED ORDER — QUETIAPINE FUMARATE 50 MG PO TABS: 50.0000 mg | ORAL_TABLET | Freq: Every day | ORAL | Status: DC
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
## 2014-09-30 NOTE — ED Notes (Signed)
Patient verbalizes, "I'll just leave if the doctor ain't gonna come in and see me."  Apologized for the wait and explained patient acuity and patient load.

## 2014-09-30 NOTE — Discharge Instructions (Signed)
Panic Attacks Panic attacks are sudden, short feelings of great fear or discomfort. You may have them for no reason when you are relaxed, when you are uneasy (anxious), or when you are sleeping.  HOME CARE  Take all your medicines as told.  Check with your doctor before starting new medicines.  Keep all doctor visits. GET HELP IF:  You are not able to take your medicines as told.  Your symptoms do not get better.  Your symptoms get worse. GET HELP RIGHT AWAY IF:  Your attacks seem different than your normal attacks.  You have thoughts about hurting yourself or others.  You take panic attack medicine and you have a side effect. MAKE SURE YOU:  Understand these instructions.  Will watch your condition.  Will get help right away if you are not doing well or get worse. Document Released: 03/15/2010 Document Revised: 12/01/2012 Document Reviewed: 09/24/2012 [facility] Patient Information 2015 [place], [place]. This information is not intended to replace advice given to you by your health care provider. Make sure you discuss any questions you have with your health care provider.   Emergency Department Resource Guide 1) Find a Doctor and Pay Out of Pocket Although you won't have to find out who is covered by your insurance plan, it is a good idea to ask around and get recommendations. You will then need to call the office and see if the doctor you have chosen will accept you as a new patient and what types of options they offer for patients who are self-pay. Some doctors offer discounts or will set up payment plans for their patients who do not have insurance, but you will need to ask so you aren't surprised when you get to your appointment.  2) Contact Your Local Health Department Not all health departments have doctors that can see patients for sick visits, but many do, so it is worth a call to see if yours does. If you don't know where your local health department is, you can check in  your phone book. The CDC also has a tool to help you locate your state's health department, and many state websites also have listings of all of their local health departments.  3) Find a [facility] If your illness is not likely to be very severe or complicated, you may want to try a walk in clinic. These are popping up all over the country in pharmacies, drugstores, and shopping centers. They're usually staffed by nurse practitioners or physician assistants that have been trained to treat common illnesses and complaints. They're usually fairly quick and inexpensive. However, if you have serious medical issues or chronic medical problems, these are probably not your best option.  No Primary Care Doctor: - Call Health Connect at  [phone] - they can help you locate a primary care doctor that  accepts your insurance, provides certain services, etc. - Physician Referral Service- [phone]  Chronic Pain Problems: Organization         Address  Phone   Notes  [facility]  [phone] Patients need to be referred by their primary care doctor.   Medication Assistance: Organization         Address  Phone   Notes  [facility] Medication [facility] [address] [place], [place] [address] [phone] --Must be a resident of [facility] -- Must have NO insurance coverage whatsoever (no Medicaid/ Medicare, etc.) -- The pt. MUST have a primary care doctor  that directs their care regularly and follows them in the community   Med[facility]ist  [phone]   [employer]  [phone]    Agencies that provide inexpensive medical care: Organization         Address  Phone   Notes  [place]  [phone]   [facility]    [phone]   [facility] [place], [place] [address] [phone]   [place] [address], [place] [phone]    Planned Parenthood    [phone]   [facility]    [phone]   [place] [address], [place] Phone:  [phone], Fax:  [phone] Hours of Operation:  9 am - 6 pm, M-F.  Also accepts Medicaid/Medicare and self-pay.  [facility] [place] for [place], [address], [place] Phone: [phone], Fax: [phone]. Hours of Operation:  8:30 am - 5:30 pm, M-F.  Also accepts Medicaid and self-pay.  [facility] [place] [address] [place] Phone: [phone]   [place], [place], [place] [phone], Ext. [phone] Mondays & Thursdays: 7-9 AM.  First 15 patients are seen on a first come, first serve basis.    [place] Providers:  Organization         Address  Phone   Notes  [facility] [address], [place] [phone] Also accepts self-pay patients.  [facility] [address], [place]  [phone]   [place], [address], [place] [phone]   [facility] [address] [place] [phone]   Lucianne Lei [address], [place]   [phone] Only accepts [place] Access [place] patients after they have their name applied to their card.   Self-Pay (no insurance) in [facility]:  Organization         Address  Phone   Notes  Sickle Cell Patients, [facility] [place] [phone]   [facility] [place] [phone]   [facility] [place]  [place], [place], [place] [phone]   [facility]/Dr. Osei-Bonsu  [address] [place] or [address], [place] [phone] Phone number for both [place] and [place] locations is the  same.  Urgent Medical and [facility] [address] [place] [phone]   [facility] [address], [place] or [address] [phone]   [facility] [address], [place] [phone], phone; [phone], fax Sees patients 1st and 3rd Saturday of every month.  Must not qualify for public or private insurance (i.e. Medicaid, Medicare, [place] Health Choice, Veterans' Benefits)  Household income should be no more than 200% of the poverty level The clinic cannot treat you if you are pregnant or think you are pregnant  Sexually transmitted diseases are not treated at the clinic.    Dental Care: Organization         Address  Phone  Notes  [facility] [address] [place], [place] [phone] Accepts children  up to age 37 who are enrolled in Medicaid or [place] Health Choice; pregnant women with a Medicaid card; and children who have applied for Medicaid or [place] Health Choice, but were declined, whose parents can pay a reduced fee at time of service.  [facility] Department of [facility]  [address], [place] [phone] Accepts children up to age 47 who are enrolled in [place] or [place]; pregnant women with a Medicaid card; and children who have applied for Medicaid or [place] Health Choice, but were declined, whose parents can pay a reduced fee at time of service.  [facility] Adult [facility] Access PRO[facility]  [place] [phone] Patients are seen by appointment only. Walk-ins are not accepted. [place] will see patients 84 years of age and older. Monday - Tuesday (8am-5pm) Most Wednesdays (8:30-5pm) $30 per visit, cash only  [facility] Adult Dental Access PROGRAM  [address], [place] [facility] [phone] Patients are seen by appointment only. Walk-ins are not accepted. [place] will see patients  31 years of age and older. One Wednesday Evening (Monthly: Volunteer Based).  $30 per visit, cash only  [place]  [phone] for adults; Children under age 74, call Graduate Pediatric Dentistry at [phone]. Children aged 23-14, please call [phone] to request a pediatric application.  Dental services are provided in all areas of dental care including fillings, crowns and bridges, complete and partial dentures, implants, gum treatment, root canals, and extractions. Preventive care is also provided. Treatment is provided to both adults and children. Patients are selected via a lottery and there is often a waiting list.   [facility] [address] [place]  [phone] [email or web address]   [facility] [address] [place], [place] [phone], Ext. [phone] Second and Fourth Thursday of each month, opens at 6:30 AM; Clinic ends at 9 AM.  Patients are seen on a first-come first-served basis, and a limited number are seen during each clinic.   [facility]  [address] [place], [place] [phone]   Eligibility Requirements You must have lived in [place], [place], or [place] counties for at least the last three months.   You cannot be eligible for state or federal sponsored [employer], including [employer], [place], or [employer].   You generally cannot be eligible for healthcare insurance through your employer.    How to apply: Eligibility screenings are held every Tuesday and Wednesday afternoon from 1:00 pm until 4:00 pm. You do not need an appointment for the interview!  [facility] [address] [place], [place]   [place]  [place] Department  [place]  [phone]    Behavioral Health Resources in the Community: Intensive Outpatient  Programs Organization         Address  Phone  Notes  [place] [employer]. [address], [place], [place] [phone]   [facility] [address] [place], [place]   ADS: Alcohol & Drug Svcs [address], [place], [place]   [place] [address]  [place], [place] or [phone]   Substance Abuse Resources Organization         Address  Phone  Notes  Alcohol and Drug Services  [phone]   Addiction  [facility]  [phone]   [facility] [place]  [phone]   [place] Pester  [phone]   Residential & Outpatient Substance Abuse Program  [phone]   Psychological Services Organization         Address  Phone  Notes  [facility] [place]  [place]  [phone]   [place] [address] [place] or [phone]    Mobile Crisis Teams Organization         Address  Phone  Notes  [facility], Mobile Crisis Care Unit  [phone]   [facility]  [address] [place], [place]   [place] Levels [address], [place] [phone]    Self-Help/Support Groups Organization         Address  Phone             Notes  [place]. of [place] - variety of support groups  [place] Call for more information  Narcotics Anonymous (NA), [facility]aring [facility] [address], [employer]   2 meetings at this location   Special educational needs teacher         Address  Phone  Notes  [facility] Residential Treatment [place],    [place]  [phone]   [facility]  [address] [place] [id number], [place], [place]   [place], [place] [phone] Admissions: 8am-3pm M-F  Incentives Substance [place] [address],    [place], [place]  [phone]   [facility] [address] [place], [place], [place]   [facility] [address],  [place], [place]   Insight Programs - Intensive Outpatient [address] [place], [place], [employer]   [facility].,  [place], [place] [phone] or [phone]   [facility] Services (RTS) [address] [place], [place] Accepts Medicaid  [facility] [place] [address],  [place] [phone] Substance Abuse/Addiction Treatment   [facility] Organization         Address  Phone  Notes  [facility]  [phone]   Domenic Schwab, PhD [address] [place], [place]   [phone] or [phone]   [place], [place] [phone]   [facility] [address], [place], [place] [phone] Insurance/Medicaid/sponsorship through [facility] and Families [address]                                    [place], [place] [phone] [place] [address], [place] [phone]    Dr. Adele Schilder  [phone]   [facility] Dept. 1) [address], [place] 2) [place] 3)  [address], [place] [phone]  [phone]   [place] [phone] or [phone] (After Hours)

## 2014-10-27 ENCOUNTER — Inpatient Hospital Stay (HOSPITAL_COMMUNITY)
Admission: AD | Admit: 2014-10-27 | Discharge: 2014-10-27 | Disposition: A | Discharge status: Home / Self Care | Payer: Medicaid Other | Source: Ambulatory Visit | Attending: Obstetrics & Gynecology | Admitting: Obstetrics & Gynecology

## 2014-10-27 ENCOUNTER — Encounter (HOSPITAL_COMMUNITY): Payer: Self-pay | Admitting: *Deleted

## 2014-10-27 ENCOUNTER — Inpatient Hospital Stay (HOSPITAL_COMMUNITY): Payer: Medicaid Other

## 2014-10-27 DIAGNOSIS — O208 Other hemorrhage in early pregnancy: Secondary | ICD-10-CM | POA: Diagnosis not present

## 2014-10-27 DIAGNOSIS — O43891 Other placental disorders, first trimester: Secondary | ICD-10-CM

## 2014-10-27 DIAGNOSIS — R109 Unspecified abdominal pain: Secondary | ICD-10-CM | POA: Diagnosis not present

## 2014-10-27 DIAGNOSIS — O26891 Other specified pregnancy related conditions, first trimester: Secondary | ICD-10-CM | POA: Insufficient documentation

## 2014-10-27 DIAGNOSIS — O468X1 Other antepartum hemorrhage, first trimester: Secondary | ICD-10-CM

## 2014-10-27 DIAGNOSIS — R102 Pelvic and perineal pain: Secondary | ICD-10-CM | POA: Diagnosis present

## 2014-10-27 DIAGNOSIS — Z3A01 Less than 8 weeks gestation of pregnancy: Secondary | ICD-10-CM | POA: Insufficient documentation

## 2014-10-27 DIAGNOSIS — O418X10 Other specified disorders of amniotic fluid and membranes, first trimester, not applicable or unspecified: Secondary | ICD-10-CM

## 2014-10-27 DIAGNOSIS — O9989 Other specified diseases and conditions complicating pregnancy, childbirth and the puerperium: Secondary | ICD-10-CM

## 2014-10-27 DIAGNOSIS — O26899 Other specified pregnancy related conditions, unspecified trimester: Secondary | ICD-10-CM

## 2014-10-27 LAB — URINALYSIS, ROUTINE W REFLEX MICROSCOPIC
BILIRUBIN URINE: NEGATIVE
GLUCOSE, UA: NEGATIVE mg/dL
Hgb urine dipstick: NEGATIVE
Ketones, ur: NEGATIVE mg/dL
Leukocytes, UA: NEGATIVE
NITRITE: NEGATIVE
PH: 7.5 (ref 5.0–8.0)
Protein, ur: NEGATIVE mg/dL
Specific Gravity, Urine: 1.02 (ref 1.005–1.030)
Urobilinogen, UA: 2 mg/dL — ABNORMAL HIGH (ref 0.0–1.0)

## 2014-10-27 LAB — CBC
HEMATOCRIT: 36.7 % (ref 36.0–46.0)
Hemoglobin: 11.6 g/dL — ABNORMAL LOW (ref 12.0–15.0)
MCH: 26.2 pg (ref 26.0–34.0)
MCHC: 31.6 g/dL (ref 30.0–36.0)
MCV: 83 fL (ref 78.0–100.0)
Platelets: 244 10*3/uL (ref 150–400)
RBC: 4.42 MIL/uL (ref 3.87–5.11)
RDW: 16.2 % — ABNORMAL HIGH (ref 11.5–15.5)
WBC: 11.2 10*3/uL — AB (ref 4.0–10.5)

## 2014-10-27 LAB — WET PREP, GENITAL
Trich, Wet Prep: NONE SEEN
WBC WET PREP: NONE SEEN
Yeast Wet Prep HPF POC: NONE SEEN

## 2014-10-27 LAB — HCG, QUANTITATIVE, PREGNANCY: hCG, Beta Chain, Quant, S: 1135 m[IU]/mL — ABNORMAL HIGH (ref <5)

## 2014-10-27 LAB — POCT PREGNANCY, URINE: Preg Test, Ur: POSITIVE — AB

## 2014-10-27 NOTE — Discharge Instructions (Signed)

## 2014-10-27 NOTE — MAU Provider Note (Signed)
History     CSN: [id number]  Arrival date and time: 10/27/14 1109   First Provider Initiated Contact with Patient 10/27/14 1307      Chief Complaint  Patient presents with  . Abdominal Pain  . Pelvic Pain   This is a 34 y.o. female at [redacted]w[redacted]d who presents with c/o 2 episodes of pain in her "pelvis and vagina" which doubled her over. Lasted about 10 min then stopped. Happened "the other day" and today. Has not seen anyone for this.  Denies bleeding. Reports a surgical abortion 2 years ago and is worried that is contributing to this.   Patient is a 34 y.o. female presenting with abdominal pain. The history is provided by the patient.  Abdominal Pain The primary symptoms of the illness include abdominal pain. The primary symptoms of the illness do not include fever, nausea, vomiting, diarrhea or dysuria. The current episode started 2 days ago.  The abdominal pain is located in the suprapubic region. The abdominal pain does not radiate. The abdominal pain is relieved by nothing.  The patient states that she believes she is currently pregnant. The patient has had a change in bowel habit (constipation). Additional symptoms associated with the illness include constipation. Symptoms associated with the illness do not include chills or back pain.   OB History    Gravida Para Term Preterm AB TAB SAB Ectopic Multiple Living   7 2 1 1 3 2 1   2       Past Medical History  Diagnosis Date  . Anxiety   . Depression   . ADD (attention deficit disorder)   . Heart murmur   . Cyst of cervix   . Chlamydia   . Complication of anesthesia     Past Surgical History  Procedure Laterality Date  . Cesarean section      Family History  Problem Relation Age of Onset  . Diabetes Maternal Aunt   . Diabetes Maternal Grandmother     Social History  Substance Use Topics  . Smoking status: Current Every Day Smoker -- 0.50 packs/day  . Smokeless tobacco: None  . Alcohol Use: 3.3 oz/week    3 Glasses  of wine, 3 Standard drinks or equivalent per week     Comment: occasional    Allergies: No Known Allergies  Prescriptions prior to admission  Medication Sig Dispense Refill Last Dose  . ibuprofen (ADVIL,MOTRIN) 200 MG tablet Take 600 mg by mouth every 6 (six) hours as needed for fever or moderate pain.    10/26/2014 at Unknown time  . acetaminophen (TYLENOL) 500 MG tablet Take 1,000 mg by mouth every 6 (six) hours as needed for mild pain or headache.   prn  . albuterol (PROVENTIL HFA;VENTOLIN HFA) 108 (90 BASE) MCG/ACT inhaler Inhale 1-2 puffs into the lungs every 6 (six) hours as needed for wheezing or shortness of breath. (Patient not taking: Reported on 03/28/2014) 1 Inhaler 0 Not Taking at Unknown time  . LORazepam (ATIVAN) 0.5 MG tablet Take 1 tablet (0.5 mg total) by mouth every 8 (eight) hours as needed for anxiety. (Patient not taking: Reported on 10/27/2014) 20 tablet 0 Not Taking at Unknown time  . predniSONE (DELTASONE) 20 MG tablet Take 2 tablets (40 mg total) by mouth daily. (Patient not taking: Reported on 09/15/2014) 10 tablet 0 Not Taking at Unknown time  . QUEtiapine (SEROQUEL) 50 MG tablet Take 1 tablet (50 mg total) by mouth at bedtime. (Patient not taking: Reported on 10/27/2014) 20 tablet  0 Not Taking at Unknown time   Medical, Surgical, Family and Social histories reviewed and are listed above.  Medications and allergies reviewed.   Review of Systems  Constitutional: Negative for fever and chills.  Cardiovascular: Negative for chest pain.  Gastrointestinal: Positive for abdominal pain and constipation. Negative for nausea, vomiting and diarrhea.  Genitourinary: Negative for dysuria.  Musculoskeletal: Negative for back pain.  Neurological: Negative for dizziness.  Psychiatric/Behavioral: Negative for depression.  Other systems negative  Physical Exam   Blood pressure 105/52, pulse 66, temperature 97.9 F (36.6 C), temperature source Oral, resp. rate 18, height 5\' 3"  (1.6  m), weight 184 lb 9.6 oz (83.734 kg), last menstrual period 09/16/2014, unknown if currently breastfeeding.  Physical Exam  Constitutional: She is oriented to person, place, and time. She appears well-developed and well-nourished. No distress.  HENT:  Head: Normocephalic.  Cardiovascular: Normal rate and regular rhythm.   Respiratory: Effort normal. No respiratory distress.  GI: Soft. She exhibits no distension and no mass. There is no tenderness. There is no rebound and no guarding.  Genitourinary: Vagina normal. No vaginal discharge found.  Cervix long and closed No CMT Uterus small less than 6 wk size Adnexae nontender bilaterally  Musculoskeletal: Normal range of motion.  Neurological: She is alert and oriented to person, place, and time.  Skin: Skin is warm and dry. She is not diaphoretic.  Psychiatric: She has a normal mood and affect.    MAU Course  Procedures  MDM This pain could represent a normal pregnancy with cramping, spontaneous abortion or even an ectopic which can be life-threatening.   Cultures were done to rule out pelvic infection Blood drawn for Quant HCG, CBC  Results for orders placed or performed during the hospital encounter of 10/27/14 (from the past 24 hour(s))  Urinalysis, Routine w reflex microscopic (not at [facility])     Status: Abnormal   Collection Time: 10/27/14 11:35 AM  Result Value Ref Range   Color, Urine YELLOW YELLOW   APPearance CLEAR CLEAR   Specific Gravity, Urine 1.020 1.005 - 1.030   pH 7.5 5.0 - 8.0   Glucose, UA NEGATIVE NEGATIVE mg/dL   Hgb urine dipstick NEGATIVE NEGATIVE   Bilirubin Urine NEGATIVE NEGATIVE   Ketones, ur NEGATIVE NEGATIVE mg/dL   Protein, ur NEGATIVE NEGATIVE mg/dL   Urobilinogen, UA 2.0 (H) 0.0 - 1.0 mg/dL   Nitrite NEGATIVE NEGATIVE   Leukocytes, UA NEGATIVE NEGATIVE  Pregnancy, urine POC     Status: Abnormal   Collection Time: 10/27/14 11:43 AM  Result Value Ref Range   Preg Test, Ur POSITIVE (A) NEGATIVE   Wet prep, genital     Status: Abnormal   Collection Time: 10/27/14 12:55 PM  Result Value Ref Range   Yeast Wet Prep HPF POC NONE SEEN NONE SEEN   Trich, Wet Prep NONE SEEN NONE SEEN   Clue Cells Wet Prep HPF POC FEW (A) NONE SEEN   WBC, Wet Prep HPF POC NONE SEEN NONE SEEN  CBC     Status: Abnormal   Collection Time: 10/27/14  1:22 PM  Result Value Ref Range   WBC 11.2 (H) 4.0 - 10.5 K/uL   RBC 4.42 3.87 - 5.11 MIL/uL   Hemoglobin 11.6 (L) 12.0 - 15.0 g/dL   HCT 36.7 36.0 - 46.0 %   MCV 83.0 78.0 - 100.0 fL   MCH 26.2 26.0 - 34.0 pg   MCHC 31.6 30.0 - 36.0 g/dL   RDW 16.2 (H) 11.5 -  15.5 %   Platelets 244 150 - 400 K/uL  hCG, quantitative, pregnancy     Status: Abnormal   Collection Time: 10/27/14  1:22 PM  Result Value Ref Range   hCG, Beta Chain, Quant, S 1135 (H) <5 mIU/mL   US Ob Comp Less 14 Wks  10/27/2014   CLINICAL DATA:  Severe abdominal and pelvic pain and cramping affecting pregnancy. Gestational age by LMP of 5 weeks 6 days.  EXAM: OBSTETRIC <14 WK [place] AND TRANSVAGINAL OB US  TECHNIQUE: Both transabdominal and transvaginal ultrasound examinations were performed for complete evaluation of the gestation as well as the maternal uterus, adnexal regions, and pelvic cul-de-sac. Transvaginal technique was performed to assess early pregnancy.  COMPARISON:  None.  FINDINGS: Intrauterine gestational sac: Visualized/normal in shape.  Yolk sac:  Not visualized  Embryo:  Not visualized  MSD: 4  mm   5 w   1  d  Maternal uterus/adnexae: Large subchorionic hemorrhage is noted. 2 cm right ovarian corpus luteum seen. Normal appearance of left ovary. No adnexal mass or free fluid identified.  IMPRESSION: Probable early intrauterine gestational sac, but no yolk sac, fetal pole, or cardiac activity yet visualized. Large subchorionic hemorrhage also noted, which is a poor prognostic sign.  Recommend follow-up quantitative B-HCG levels, and follow-up US in 14 days to assess viability. This  recommendation follows SRU consensus guidelines: Diagnostic Criteria for Nonviable Pregnancy Early in the First Trimester. [place] Med 2013; 517:0017-49.   Electronically Signed   By: Earle Gell M.D.   On: 10/27/2014 16:40   US Ob Transvaginal  10/27/2014   CLINICAL DATA:  Severe abdominal and pelvic pain and cramping affecting pregnancy. Gestational age by LMP of 5 weeks 6 days.  EXAM: OBSTETRIC <14 WK [place] AND TRANSVAGINAL OB US  TECHNIQUE: Both transabdominal and transvaginal ultrasound examinations were performed for complete evaluation of the gestation as well as the maternal uterus, adnexal regions, and pelvic cul-de-sac. Transvaginal technique was performed to assess early pregnancy.  COMPARISON:  None.  FINDINGS: Intrauterine gestational sac: Visualized/normal in shape.  Yolk sac:  Not visualized  Embryo:  Not visualized  MSD: 4  mm   5 w   1  d  Maternal uterus/adnexae: Large subchorionic hemorrhage is noted. 2 cm right ovarian corpus luteum seen. Normal appearance of left ovary. No adnexal mass or free fluid identified.  IMPRESSION: Probable early intrauterine gestational sac, but no yolk sac, fetal pole, or cardiac activity yet visualized. Large subchorionic hemorrhage also noted, which is a poor prognostic sign.  Recommend follow-up quantitative B-HCG levels, and follow-up US in 14 days to assess viability. This recommendation follows SRU consensus guidelines: Diagnostic Criteria for Nonviable Pregnancy Early in the First Trimester. [place] Corning Med 2013; 449:6759-16.   Electronically Signed   By: Earle Gell M.D.   On: 10/27/2014 16:40     Assessment and Plan  A:  Abdominal pain in early pregnancy       Intrauterine gestational sac at [redacted]w[redacted]d, empty, cannot rule out ectopic pregnancy      Large subchorionic hemorrhage  P:  DIscussed results with patient       Reviewed that we cannot rule out ectopic and also that there is a large [facility] which is a poor prognostic sign.         Recommend repeating  Quant HCG level in 48 hours        Ectopic and SAB precautions.   Hansel Feinstein 10/27/2014, 1:07 PM

## 2014-10-27 NOTE — MAU Note (Signed)
Pt due to start period this week, has not started.  Extreme vaginal & lower abd pain started Sunday night, had this pain again last night - unable to move, in fetal position on floor.  Still having discomfort this morning, not as severe as last night.

## 2014-10-28 LAB — HIV ANTIBODY (ROUTINE TESTING W REFLEX): HIV Screen 4th Generation wRfx: NONREACTIVE

## 2014-10-29 ENCOUNTER — Inpatient Hospital Stay (HOSPITAL_COMMUNITY)
Admission: AD | Admit: 2014-10-29 | Discharge: 2014-10-29 | Disposition: A | Discharge status: Home / Self Care | Payer: Medicaid Other | Source: Ambulatory Visit | Attending: Obstetrics and Gynecology | Admitting: Obstetrics and Gynecology

## 2014-10-29 DIAGNOSIS — R109 Unspecified abdominal pain: Secondary | ICD-10-CM | POA: Diagnosis present

## 2014-10-29 DIAGNOSIS — O26899 Other specified pregnancy related conditions, unspecified trimester: Secondary | ICD-10-CM

## 2014-10-29 DIAGNOSIS — Z3A01 Less than 8 weeks gestation of pregnancy: Secondary | ICD-10-CM | POA: Insufficient documentation

## 2014-10-29 DIAGNOSIS — O3680X0 Pregnancy with inconclusive fetal viability, not applicable or unspecified: Secondary | ICD-10-CM

## 2014-10-29 DIAGNOSIS — O26891 Other specified pregnancy related conditions, first trimester: Secondary | ICD-10-CM | POA: Insufficient documentation

## 2014-10-29 DIAGNOSIS — O9989 Other specified diseases and conditions complicating pregnancy, childbirth and the puerperium: Secondary | ICD-10-CM

## 2014-10-29 LAB — HCG, QUANTITATIVE, PREGNANCY: hCG, Beta Chain, Quant, S: 3023 m[IU]/mL — ABNORMAL HIGH (ref <5)

## 2014-10-29 NOTE — MAU Note (Signed)
Pt states here for repeat BHCG. Denies pain or bleeding.

## 2014-10-29 NOTE — MAU Provider Note (Signed)
Ms. Nicole Robinson  is a 34 y.o. [id number] at [redacted]w[redacted]d who presents to MAU today for follow-up quant hCG after 48 hours. She denies abdominal pain, vaginal bleeding or fever today.   BP 107/51 mmHg  Pulse 64  Temp(Src) 98.7 F (37.1 C) (Oral)  Resp 18  LMP 09/16/2014 (Approximate)  CONSTITUTIONAL: Well-developed, well-nourished female in no acute distress.  ENT: External right and left ear normal.  EYES: EOM intact, conjunctivae normal.  MUSCULOSKELETAL: Normal range of motion.  CARDIOVASCULAR: Regular heart rate RESPIRATORY: Normal effort NEUROLOGICAL: Alert and oriented to person, place, and time.  SKIN: Skin is warm and dry. No rash noted. Not diaphoretic. No erythema. No pallor. PSYCH: Normal mood and affect. Normal behavior. Normal judgment and thought content.  Results for [name], [name] (MRN [id number]) as of 10/29/2014 18:47  Ref. Range 10/27/2014 13:22 10/27/2014 16:31 10/29/2014 17:33  HCG, Beta Chain, Quant, S Latest Ref Range: <5 mIU/mL 1135 (H)  3023 (H)    A: Appropriate rise in quant hCG after 48 hours  P: Discharge home First trimester/ectopic precautions discussed Patient will return for follow-up US in 1 week. Order placed. They will call the patient with an appointment time Patient may return to MAU as needed or if her condition were to change or worsen   Luvenia Redden, PA-C 10/29/2014 6:46 PM

## 2014-10-29 NOTE — Discharge Instructions (Signed)
First Trimester of Pregnancy The first trimester of pregnancy is from week 1 until the end of week 12 (months 1 through 3). During this time, your baby will begin to develop inside you. At 6-8 weeks, the eyes and face are formed, and the heartbeat can be seen on ultrasound. At the end of 12 weeks, all the baby's organs are formed. Prenatal care is all the medical care you receive before the birth of your baby. Make sure you get good prenatal care and follow all of your doctor's instructions. HOME CARE  Medicines  Take medicine only as told by your doctor. Some medicines are safe and some are not during pregnancy.  Take your prenatal vitamins as told by your doctor.  Take medicine that helps you poop (stool softener) as needed if your doctor says it is okay. Diet  Eat regular, healthy meals.  Your doctor will tell you the amount of weight gain that is right for you.  Avoid raw meat and uncooked cheese.  If you feel sick to your stomach (nauseous) or throw up (vomit):  Eat 4 or 5 small meals a day instead of 3 large meals.  Try eating a few soda crackers.  Drink liquids between meals instead of during meals.  If you have a hard time pooping (constipation):  Eat high-fiber foods like fresh vegetables, fruit, and whole grains.  Drink enough fluids to keep your pee (urine) clear or pale yellow. Activity and Exercise  Exercise only as told by your doctor. Stop exercising if you have cramps or pain in your lower belly (abdomen) or low back.  Try to avoid standing for long periods of time. Move your legs often if you must stand in one place for a long time.  Avoid heavy lifting.  Wear low-heeled shoes. Sit and stand up straight.  You can have sex unless your doctor tells you not to. Relief of Pain or Discomfort  Wear a good support bra if your breasts are sore.  Take warm water baths (sitz baths) to soothe pain or discomfort caused by hemorrhoids. Use hemorrhoid cream if your  doctor says it is okay.  Rest with your legs raised if you have leg cramps or low back pain.  Wear support hose if you have puffy, bulging veins (varicose veins) in your legs. Raise (elevate) your feet for 15 minutes, 3-4 times a day. Limit salt in your diet. Prenatal Care  Schedule your prenatal visits by the twelfth week of pregnancy.  Write down your questions. Take them to your prenatal visits.  Keep all your prenatal visits as told by your doctor. Safety  Wear your seat belt at all times when driving.  Make a list of emergency phone numbers. The list should include numbers for family, friends, the hospital, and police and fire departments. General Tips  Ask your doctor for a referral to a local prenatal class. Begin classes no later than at the start of month 6 of your pregnancy.  Ask for help if you need counseling or help with nutrition. Your doctor can give you advice or tell you where to go for help.  Do not use hot tubs, steam rooms, or saunas.  Do not douche or use tampons or scented sanitary pads.  Do not cross your legs for long periods of time.  Avoid litter boxes and soil used by cats.  Avoid all smoking, herbs, and alcohol. Avoid drugs not approved by your doctor.  Visit your dentist. At home, brush your teeth  with a soft toothbrush. Be gentle when you floss. GET HELP IF:  You are dizzy.  You have mild cramps or pressure in your lower belly.  You have a nagging pain in your belly area.  You continue to feel sick to your stomach, throw up, or have watery poop (diarrhea).  You have a bad smelling fluid coming from your vagina.  You have pain with peeing (urination).  You have increased puffiness (swelling) in your face, hands, legs, or ankles. GET HELP RIGHT AWAY IF:   You have a fever.  You are leaking fluid from your vagina.  You have spotting or bleeding from your vagina.  You have very bad belly cramping or pain.  You gain or lose weight  rapidly.  You throw up blood. It may look like coffee grounds.  You are around people who have [place] measles, fifth disease, or chickenpox.  You have a very bad headache.  You have shortness of breath.  You have any kind of trauma, such as from a fall or a car accident. Document Released: 07/30/2007 Document Revised: 06/27/2013 Document Reviewed: 12/21/2012 [facility] Patient Information 2015 [place], [place]. This information is not intended to replace advice given to you by your health care provider. Make sure you discuss any questions you have with your health care provider. Human Chorionic Gonadotropin (hCG) This is a test to confirm and monitor pregnancy or to diagnose trophoblastic disease or germ cell tumors. As early as 10 days after a missed menstrual period (some methods can detect hCG even earlier, at one week after conception) or if your caregiver thinks that your symptoms suggest ectopic pregnancy, a failing pregnancy, trophoblastic disease, or germ cell tumors. hCG is a protein produced in the placenta of a pregnant woman. A pregnancy test is a specific blood or urine test that can detect hCG and confirm pregnancy. This hormone is able to be detected 10 days after a missed menstrual period, the time period when the fertilized egg is implanted in the woman's uterus. With some methods, hCG can be detected even earlier, at one week after conception.  During the early weeks of pregnancy, hCG is important in maintaining function of the corpus luteum (the mass of cells that forms from a mature egg). Production of hCG increases steadily during the first trimester (8-10 weeks), peaking around the 10th week after the last menstrual cycle. Levels then fall slowly during the remainder of the pregnancy. hCG is no longer detectable within a few weeks after delivery. hCG is also produced by some germ cell tumors and increased levels are seen in trophoblastic disease. SAMPLE COLLECTION hCG is commonly  detected in urine. The preferred specimen is a random urine sample collected first thing in the morning. hCG can also be measured in blood drawn from a vein in the arm. NORMAL FINDINGS Qualitative:negative in non-pregnant women; positive in pregnancy Quantitative:   Gestation less than 1 week: 5-50 Whole HCG (milli-international units/mL)  Gestation of 2 weeks: 50-500 Whole HCG (milli-international units/mL)  Gestation of 3 weeks: 100-10,000 Whole HCG (milli-international units/mL)  Gestation of 4 weeks: 1,000-30,000 Whole HCG (milli-international units/mL)  Gestation of 5 weeks 3,500-115,000 Whole HCG (milli-international units/mL)  Gestation of 6-8 weeks: 12,000-270,000 Whole HCG (milli-international units/mL)  Gestation of 12 weeks: 15,000-220,000 Whole HCG (milli-international units/mL)  Males and non-pregnant females: less than 5 Whole HCG (milli-international units/mL) Beta subunit: depends on the method and test used Ranges for normal findings may vary among different laboratories and hospitals. You should always  check with your doctor after having lab work or other tests done to discuss the meaning of your test results and whether your values are considered within normal limits. MEANING OF TEST  Your caregiver will go over the test results with you and discuss the importance and meaning of your results, as well as treatment options and the need for additional tests if necessary. OBTAINING THE TEST RESULTS It is your responsibility to obtain your test results. Ask the lab or department performing the test when and how you will get your results. Document Released: 03/14/2004 Document Revised: 05/05/2011 Document Reviewed: 05/16/2013 [facility] Patient Information 2015 [place], [place]. This information is not intended to replace advice given to you by your health care provider. Make sure you discuss any questions you have with your health care provider.

## 2014-10-31 LAB — GC/CHLAMYDIA PROBE AMP (~~LOC~~) NOT AT ARMC
CHLAMYDIA, DNA PROBE: NEGATIVE
NEISSERIA GONORRHEA: NEGATIVE

## 2014-11-06 ENCOUNTER — Encounter (HOSPITAL_COMMUNITY): Payer: Self-pay | Admitting: Medical

## 2014-11-06 ENCOUNTER — Ambulatory Visit (HOSPITAL_COMMUNITY)
Admission: RE | Admit: 2014-11-06 | Discharge: 2014-11-06 | Disposition: A | Discharge status: Home / Self Care | Payer: Medicaid Other | Source: Ambulatory Visit | Attending: Medical | Admitting: Medical

## 2014-11-06 ENCOUNTER — Inpatient Hospital Stay (HOSPITAL_COMMUNITY)
Admission: AD | Admit: 2014-11-06 | Discharge: 2014-11-06 | Disposition: A | Discharge status: Home / Self Care | Payer: Medicaid Other | Source: Ambulatory Visit | Attending: Family Medicine | Admitting: Family Medicine

## 2014-11-06 DIAGNOSIS — O468X1 Other antepartum hemorrhage, first trimester: Principal | ICD-10-CM

## 2014-11-06 DIAGNOSIS — O3680X0 Pregnancy with inconclusive fetal viability, not applicable or unspecified: Secondary | ICD-10-CM

## 2014-11-06 DIAGNOSIS — O26893 Other specified pregnancy related conditions, third trimester: Secondary | ICD-10-CM | POA: Diagnosis not present

## 2014-11-06 DIAGNOSIS — R109 Unspecified abdominal pain: Secondary | ICD-10-CM | POA: Insufficient documentation

## 2014-11-06 DIAGNOSIS — O418X10 Other specified disorders of amniotic fluid and membranes, first trimester, not applicable or unspecified: Secondary | ICD-10-CM

## 2014-11-06 DIAGNOSIS — Z3A01 Less than 8 weeks gestation of pregnancy: Secondary | ICD-10-CM | POA: Insufficient documentation

## 2014-11-06 DIAGNOSIS — O26899 Other specified pregnancy related conditions, unspecified trimester: Secondary | ICD-10-CM

## 2014-11-06 NOTE — Discharge Instructions (Signed)
Subchorionic Hematoma A subchorionic hematoma is a gathering of blood between the outer wall of the placenta and the inner wall of the womb (uterus). The placenta is the organ that connects the fetus to the wall of the uterus. The placenta performs the feeding, breathing (oxygen to the fetus), and waste removal (excretory work) of the fetus.  Subchorionic hematoma is the most common abnormality found on a result from ultrasonography done during the first trimester or early second trimester of pregnancy. If there has been little or no vaginal bleeding, early small hematomas usually shrink on their own and do not affect your baby or pregnancy. The blood is gradually absorbed over 1-2 weeks. When bleeding starts later in pregnancy or the hematoma is larger or occurs in an older pregnant woman, the outcome may not be as good. Larger hematomas may get bigger, which increases the chances for miscarriage. Subchorionic hematoma also increases the risk of premature detachment of the placenta from the uterus, preterm (premature) labor, and stillbirth. HOME CARE INSTRUCTIONS  Stay on bed rest if your health care provider recommends this. Although bed rest will not prevent more bleeding or prevent a miscarriage, your health care provider may recommend bed rest until you are advised otherwise.  Avoid heavy lifting (more than 10 lb [4.5 kg]), exercise, sexual intercourse, or douching as directed by your health care provider.  Keep track of the number of pads you use each day and how soaked (saturated) they are. Write down this information.  Do not use tampons.  Keep all follow-up appointments as directed by your health care provider. Your health care provider may ask you to have follow-up blood tests or ultrasound tests or both. SEEK IMMEDIATE MEDICAL CARE IF:  You have severe cramps in your stomach, back, abdomen, or pelvis.  You have a fever.  You pass large clots or tissue. Save any tissue for your health  care provider to look at.  Your bleeding increases or you become lightheaded, feel weak, or have fainting episodes. Document Released: 05/28/2006 Document Revised: 06/27/2013 Document Reviewed: 09/09/2012 [facility] Patient Information 2015 [place], [place]. This information is not intended to replace advice given to you by your health care provider. Make sure you discuss any questions you have with your health care provider. First Trimester of Pregnancy The first trimester of pregnancy is from week 1 until the end of week 12 (months 1 through 3). During this time, your baby will begin to develop inside you. At 6-8 weeks, the eyes and face are formed, and the heartbeat can be seen on ultrasound. At the end of 12 weeks, all the baby's organs are formed. Prenatal care is all the medical care you receive before the birth of your baby. Make sure you get good prenatal care and follow all of your doctor's instructions. HOME CARE  Medicines  Take medicine only as told by your doctor. Some medicines are safe and some are not during pregnancy.  Take your prenatal vitamins as told by your doctor.  Take medicine that helps you poop (stool softener) as needed if your doctor says it is okay. Diet  Eat regular, healthy meals.  Your doctor will tell you the amount of weight gain that is right for you.  Avoid raw meat and uncooked cheese.  If you feel sick to your stomach (nauseous) or throw up (vomit):  Eat 4 or 5 small meals a day instead of 3 large meals.  Try eating a few soda crackers.  Drink liquids between meals  instead of during meals.  If you have a hard time pooping (constipation):  Eat high-fiber foods like fresh vegetables, fruit, and whole grains.  Drink enough fluids to keep your pee (urine) clear or pale yellow. Activity and Exercise  Exercise only as told by your doctor. Stop exercising if you have cramps or pain in your lower belly (abdomen) or low back.  Try to avoid standing  for long periods of time. Move your legs often if you must stand in one place for a long time.  Avoid heavy lifting.  Wear low-heeled shoes. Sit and stand up straight.  You can have sex unless your doctor tells you not to. Relief of Pain or Discomfort  Wear a good support bra if your breasts are sore.  Take warm water baths (sitz baths) to soothe pain or discomfort caused by hemorrhoids. Use hemorrhoid cream if your doctor says it is okay.  Rest with your legs raised if you have leg cramps or low back pain.  Wear support hose if you have puffy, bulging veins (varicose veins) in your legs. Raise (elevate) your feet for 15 minutes, 3-4 times a day. Limit salt in your diet. Prenatal Care  Schedule your prenatal visits by the twelfth week of pregnancy.  Write down your questions. Take them to your prenatal visits.  Keep all your prenatal visits as told by your doctor. Safety  Wear your seat belt at all times when driving.  Make a list of emergency phone numbers. The list should include numbers for family, friends, the hospital, and police and fire departments. General Tips  Ask your doctor for a referral to a local prenatal class. Begin classes no later than at the start of month 6 of your pregnancy.  Ask for help if you need counseling or help with nutrition. Your doctor can give you advice or tell you where to go for help.  Do not use hot tubs, steam rooms, or saunas.  Do not douche or use tampons or scented sanitary pads.  Do not cross your legs for long periods of time.  Avoid litter boxes and soil used by cats.  Avoid all smoking, herbs, and alcohol. Avoid drugs not approved by your doctor.  Visit your dentist. At home, brush your teeth with a soft toothbrush. Be gentle when you floss. GET HELP IF:  You are dizzy.  You have mild cramps or pressure in your lower belly.  You have a nagging pain in your belly area.  You continue to feel sick to your stomach, throw  up, or have watery poop (diarrhea).  You have a bad smelling fluid coming from your vagina.  You have pain with peeing (urination).  You have increased puffiness (swelling) in your face, hands, legs, or ankles. GET HELP RIGHT AWAY IF:   You have a fever.  You are leaking fluid from your vagina.  You have spotting or bleeding from your vagina.  You have very bad belly cramping or pain.  You gain or lose weight rapidly.  You throw up blood. It may look like coffee grounds.  You are around people who have [place] measles, fifth disease, or chickenpox.  You have a very bad headache.  You have shortness of breath.  You have any kind of trauma, such as from a fall or a car accident. Document Released: 07/30/2007 Document Revised: 06/27/2013 Document Reviewed: 12/21/2012 [facility] Patient Information 2015 [place], [place]. This information is not intended to replace advice given to you by your health  care provider. Make sure you discuss any questions you have with your health care provider.

## 2014-11-06 NOTE — MAU Provider Note (Signed)
Ms. Nicole Robinson is a 34 y.o. [id number] at [redacted]w[redacted]d who presents to MAU today for follow-up US results. She denies abdominal pain, vaginal bleeding, N/V or fever today.   LMP 09/16/2014 (Approximate)  GENERAL: Well-developed, well-nourished female in no acute distress.  HEENT: Normocephalic, atraumatic.   LUNGS: Effort normal HEART: Regular rate  SKIN: Warm, dry and without erythema PSYCH: Normal mood and affect  US Ob Transvaginal  11/06/2014   CLINICAL DATA:  Abdominal pain in pregnancy. Pregnancy of unknown anatomic location.  EXAM: TRANSVAGINAL OB ULTRASOUND  TECHNIQUE: Transvaginal ultrasound was performed for complete evaluation of the gestation as well as the maternal uterus, adnexal regions, and pelvic cul-de-sac.  COMPARISON:  10/27/2014  FINDINGS: Intrauterine gestational sac: Visualized/normal in shape.  Yolk sac:  Visualized  Embryo:  Visualized  Cardiac Activity: Visualized  Heart Rate: 121 bpm  CRL:   3  mm   6 w 0 d                  [place] EDC: 07/02/2015  Maternal uterus/adnexae: Moderate subchorionic hemorrhage noted. Both ovaries are normal in appearance. No mass or hemoperitoneum visualized.  IMPRESSION: Single living IUP measuring 6 weeks 0 days with [place] EDC of 07/02/2015. Moderate subchorionic hemorrhage noted.   Electronically Signed   By: Earle Gell M.D.   On: 11/06/2014 15:53    A: SIUP at [redacted]w[redacted]d Moderate subchorionic hemorrhage  P: Discharge home First trimester warning signs and bleeding precautions discussed Pelvic rest advised Pregnancy confirmation letter and list of area OB provider given Patient advised to start prenatal care with provider of choice Patient may return to MAU as needed or if her condition were to change or worsen   Luvenia Redden, PA-C  11/06/2014 4:23 PM

## 2014-12-07 LAB — OB RESULTS CONSOLE RPR: RPR: NONREACTIVE

## 2014-12-07 LAB — OB RESULTS CONSOLE ABO/RH: RH Type: NEGATIVE

## 2014-12-07 LAB — OB RESULTS CONSOLE HIV ANTIBODY (ROUTINE TESTING): HIV: NONREACTIVE

## 2014-12-07 LAB — OB RESULTS CONSOLE RUBELLA ANTIBODY, IGM: RUBELLA: IMMUNE

## 2014-12-07 LAB — OB RESULTS CONSOLE GC/CHLAMYDIA
CHLAMYDIA, DNA PROBE: NEGATIVE
GC PROBE AMP, GENITAL: NEGATIVE

## 2014-12-07 LAB — OB RESULTS CONSOLE HEPATITIS B SURFACE ANTIGEN: Hepatitis B Surface Ag: NEGATIVE

## 2014-12-07 LAB — OB RESULTS CONSOLE ANTIBODY SCREEN: ANTIBODY SCREEN: NEGATIVE

## 2015-01-23 ENCOUNTER — Inpatient Hospital Stay (HOSPITAL_COMMUNITY)
Admission: AD | Admit: 2015-01-23 | Discharge: 2015-01-23 | Disposition: A | Discharge status: Home / Self Care | Payer: Medicaid Other | Source: Ambulatory Visit | Attending: Obstetrics and Gynecology | Admitting: Obstetrics and Gynecology

## 2015-01-23 ENCOUNTER — Encounter (HOSPITAL_COMMUNITY): Payer: Self-pay | Admitting: *Deleted

## 2015-01-23 DIAGNOSIS — O26892 Other specified pregnancy related conditions, second trimester: Secondary | ICD-10-CM | POA: Diagnosis not present

## 2015-01-23 DIAGNOSIS — R1013 Epigastric pain: Secondary | ICD-10-CM | POA: Insufficient documentation

## 2015-01-23 DIAGNOSIS — Z3A17 17 weeks gestation of pregnancy: Secondary | ICD-10-CM | POA: Insufficient documentation

## 2015-01-23 DIAGNOSIS — K529 Noninfective gastroenteritis and colitis, unspecified: Secondary | ICD-10-CM | POA: Insufficient documentation

## 2015-01-23 DIAGNOSIS — O9989 Other specified diseases and conditions complicating pregnancy, childbirth and the puerperium: Secondary | ICD-10-CM

## 2015-01-23 DIAGNOSIS — O26899 Other specified pregnancy related conditions, unspecified trimester: Secondary | ICD-10-CM

## 2015-01-23 DIAGNOSIS — R109 Unspecified abdominal pain: Secondary | ICD-10-CM | POA: Diagnosis not present

## 2015-01-23 LAB — AMYLASE: Amylase: 89 U/L (ref 28–100)

## 2015-01-23 LAB — COMPREHENSIVE METABOLIC PANEL
ALBUMIN: 3.4 g/dL — AB (ref 3.5–5.0)
ALT: 15 U/L (ref 14–54)
AST: 17 U/L (ref 15–41)
Alkaline Phosphatase: 56 U/L (ref 38–126)
Anion gap: 7 (ref 5–15)
BUN: 8 mg/dL (ref 6–20)
CHLORIDE: 103 mmol/L (ref 101–111)
CO2: 22 mmol/L (ref 22–32)
Calcium: 8.9 mg/dL (ref 8.9–10.3)
Creatinine, Ser: 0.5 mg/dL (ref 0.44–1.00)
GFR calc Af Amer: >60 mL/min (ref >60)
GFR calc non Af Amer: >60 mL/min (ref >60)
GLUCOSE: 92 mg/dL (ref 65–99)
POTASSIUM: 3.6 mmol/L (ref 3.5–5.1)
Sodium: 132 mmol/L — ABNORMAL LOW (ref 135–145)
Total Bilirubin: 0.6 mg/dL (ref 0.3–1.2)
Total Protein: 7.5 g/dL (ref 6.5–8.1)

## 2015-01-23 LAB — LIPASE, BLOOD: Lipase: 23 U/L (ref 11–51)

## 2015-01-23 LAB — CBC
HEMATOCRIT: 34.4 % — AB (ref 36.0–46.0)
Hemoglobin: 11.3 g/dL — ABNORMAL LOW (ref 12.0–15.0)
MCH: 27.6 pg (ref 26.0–34.0)
MCHC: 32.8 g/dL (ref 30.0–36.0)
MCV: 83.9 fL (ref 78.0–100.0)
PLATELETS: 157 10*3/uL (ref 150–400)
RBC: 4.1 MIL/uL (ref 3.87–5.11)
RDW: 14.6 % (ref 11.5–15.5)
WBC: 11.1 10*3/uL — AB (ref 4.0–10.5)

## 2015-01-23 LAB — URINALYSIS, ROUTINE W REFLEX MICROSCOPIC
BILIRUBIN URINE: NEGATIVE
Glucose, UA: NEGATIVE mg/dL
HGB URINE DIPSTICK: NEGATIVE
Ketones, ur: NEGATIVE mg/dL
Leukocytes, UA: NEGATIVE
NITRITE: NEGATIVE
PROTEIN: NEGATIVE mg/dL
SPECIFIC GRAVITY, URINE: 1.01 (ref 1.005–1.030)
pH: 5.5 (ref 5.0–8.0)

## 2015-01-23 MED ORDER — BUTORPHANOL TARTRATE 1 MG/ML IJ SOLN
2.0000 mg | Freq: Once | INTRAMUSCULAR | Status: AC
  Administered 2015-01-23: 2 mg via INTRAVENOUS
  Filled 2015-01-23: qty 2

## 2015-01-23 MED ORDER — TRAMADOL HCL 50 MG PO TABS: 50.0000 mg | ORAL_TABLET | Freq: Four times a day (QID) | ORAL | Status: DC | PRN

## 2015-01-23 MED ORDER — PROMETHAZINE HCL 25 MG PO TABS: 25.0000 mg | ORAL_TABLET | Freq: Four times a day (QID) | ORAL | Status: DC | PRN

## 2015-01-23 MED ORDER — PROMETHAZINE HCL 25 MG/ML IJ SOLN
25.0000 mg | Freq: Once | INTRAMUSCULAR | Status: AC
  Administered 2015-01-23: 25 mg via INTRAMUSCULAR
  Filled 2015-01-23: qty 1

## 2015-01-23 MED ORDER — LACTATED RINGERS IV BOLUS (SEPSIS)
1000.0000 mL | INTRAVENOUS | Status: DC
  Administered 2015-01-23: 1000 mL via INTRAVENOUS

## 2015-01-23 NOTE — MAU Note (Signed)
6 Pt called RN to bedside.  Vomited green fluid after being discharge.  Pt states, "I think the baby didn't like that medicine you gave me".  Pt placed back in the bed, cold wet wash cloth given.  Pt states she thinks she is fine but just wanted to given it a few minutes.  Marlou Porch, CNM made aware of the situation.  Pt let at 1347 and said she felt much better.

## 2015-01-23 NOTE — MAU Provider Note (Signed)
Chief Complaint: Abdominal Pain   First Provider Initiated Contact with Patient 01/23/15 [phone]     SUBJECTIVE HPI: Nicole Robinson is a 34 y.o. [id number] at [redacted]w[redacted]d who presents to Maternity Admissions reporting sharp epigastric pain since 0400. No Hx similar pain.   Location: epigastric pain Quality: sharp Severity: 7/10 on pain scale Duration: 6 hours Context: none Timing: constant Modifying factors: Took Zantac for associated reflux. Heartburn resolved, but pain did not. Associated signs and symptoms: Pos for nausea, vomiting x 1, watery diarrhea x 3, reflux. Neg for fever, chills, hematemesis, bloody stools.  Past Medical History  Diagnosis Date  . Anxiety   . Depression   . ADD (attention deficit disorder)   . Heart murmur   . Cyst of cervix   . Chlamydia   . Complication of anesthesia    OB History  Gravida Para Term Preterm AB SAB TAB Ectopic Multiple Living  7 2 1 1 3 1 2   2     # Outcome Date GA Lbr Len/2nd Weight Sex Delivery Anes PTL Lv  7 Current           6 SAB           5 TAB           4 TAB           3 Preterm      CS-LTranv     2 Term      Vag-Spont     1 Gravida              Comments: System Generated. Please review and update pregnancy details.     Past Surgical History  Procedure Laterality Date  . Cesarean section     Social History   Social History  . Marital Status: Married    Spouse Name: N/A  . Number of Children: N/A  . Years of Education: N/A   Occupational History  . Not on file.   Social History Main Topics  . Smoking status: Current Every Day Smoker -- 0.25 packs/day  . Smokeless tobacco: Not on file  . Alcohol Use: 3.3 oz/week    3 Glasses of wine, 3 Standard drinks or equivalent per week     Comment: occasional  . Drug Use: No  . Sexual Activity: Yes   Other Topics Concern  . Not on file   Social History Narrative   No current facility-administered medications on file prior to encounter.   Current Outpatient  Prescriptions on File Prior to Encounter  Medication Sig Dispense Refill  . acetaminophen (TYLENOL) 500 MG tablet Take 1,000 mg by mouth every 6 (six) hours as needed for mild pain or headache.     Allergies  Allergen Reactions  . Other     [place]-hives    I have reviewed the past Medical Hx, Surgical Hx, Social Hx, Allergies and Medications.   Review of Systems  Constitutional: Negative for chills and fatigue.  Gastrointestinal: Positive for nausea, vomiting, abdominal pain and diarrhea. Negative for constipation and blood in stool.  Genitourinary: Negative for dysuria, frequency, hematuria, flank pain, decreased urine volume, vaginal bleeding, vaginal discharge and pelvic pain.  Musculoskeletal: Negative for back pain.    OBJECTIVE Patient Vitals for the past 24 hrs:  BP Temp Temp src Pulse Resp  01/23/15 1327 129/76 mmHg 97.7 F (36.5 C) Oral 112 18  01/23/15 0916 120/63 mmHg 98.5 F (36.9 C) Oral 97 22   Constitutional: Well-developed, well-nourished female in moderate distress. Rocking.  No pallor Cardiovascular: normal rate Respiratory: normal rate and effort.  GI: Abd soft, mild epigastric tenderness. Low abd NT, gravid appropriate for gestational age. Pos BS x 4 Neurologic: Alert and oriented x 4.  GU: Neg CVAT.  SPECULUM EXAM: Deferred  FHR 150 by doppler.  LAB RESULTS Results for orders placed or performed during the hospital encounter of 01/23/15 (from the past 24 hour(s))  Urinalysis, Routine w reflex microscopic (not at [facility])     Status: None   Collection Time: 01/23/15  9:25 AM  Result Value Ref Range   Color, Urine YELLOW YELLOW   APPearance CLEAR CLEAR   Specific Gravity, Urine 1.010 1.005 - 1.030   pH 5.5 5.0 - 8.0   Glucose, UA NEGATIVE NEGATIVE mg/dL   Hgb urine dipstick NEGATIVE NEGATIVE   Bilirubin Urine NEGATIVE NEGATIVE   Ketones, ur NEGATIVE NEGATIVE mg/dL   Protein, ur NEGATIVE NEGATIVE mg/dL   Nitrite NEGATIVE NEGATIVE   Leukocytes, UA  NEGATIVE NEGATIVE  CBC     Status: Abnormal   Collection Time: 01/23/15  9:35 AM  Result Value Ref Range   WBC 11.1 (H) 4.0 - 10.5 K/uL   RBC 4.10 3.87 - 5.11 MIL/uL   Hemoglobin 11.3 (L) 12.0 - 15.0 g/dL   HCT 34.4 (L) 36.0 - 46.0 %   MCV 83.9 78.0 - 100.0 fL   MCH 27.6 26.0 - 34.0 pg   MCHC 32.8 30.0 - 36.0 g/dL   RDW 14.6 11.5 - 15.5 %   Platelets 157 150 - 400 K/uL  Comprehensive metabolic panel     Status: Abnormal   Collection Time: 01/23/15  9:35 AM  Result Value Ref Range   Sodium 132 (L) 135 - 145 mmol/L   Potassium 3.6 3.5 - 5.1 mmol/L   Chloride 103 101 - 111 mmol/L   CO2 22 22 - 32 mmol/L   Glucose, Bld 92 65 - 99 mg/dL   BUN 8 6 - 20 mg/dL   Creatinine, Ser 0.50 0.44 - 1.00 mg/dL   Calcium 8.9 8.9 - 10.3 mg/dL   Total Protein 7.5 6.5 - 8.1 g/dL   Albumin 3.4 (L) 3.5 - 5.0 g/dL   AST 17 15 - 41 U/L   ALT 15 14 - 54 U/L   Alkaline Phosphatase 56 38 - 126 U/L   Total Bilirubin 0.6 0.3 - 1.2 mg/dL   GFR calc non Af Amer >60 >60 mL/min   GFR calc Af Amer >60 >60 mL/min   Anion gap 7 5 - 15  Lipase, blood     Status: None   Collection Time: 01/23/15  9:35 AM  Result Value Ref Range   Lipase 23 11 - 51 U/L  Amylase     Status: None   Collection Time: 01/23/15  9:35 AM  Result Value Ref Range   Amylase 89 28 - 100 U/L    IMAGING No results found.  MAU COURSE CBC, CMET, Amylase, Lipase, Phenergan. Offered pain meds, declined.  Nausea improved.   Discussed Hx, exam, labs w/ Dr. Philis Pique. No imaging needed at this time. Will Tx as gastroenteritis, but pt may need OP abd [place] if no improvement.   Pt now asking for pain meds. Stadol given. Pain much better. Had ice chips, vomited x 1, but stated that she felt much better, ready for D/C. Thinks the stadol made her vomit.  MDM 34 year-old female at 17.[redacted] weeks gestation w/ acute epigastric pain, N/V/D and absence of fever or significant leukocytosis  C/W acute viral gastroenteritis. Although gall bladder Dz has not  been completely ruled out low suspicion for cholecystitis or obstruction.   ASSESSMENT 1. Gastroenteritis   2. Abdominal pain affecting pregnancy, antepartum    PLAN Discharge home in stable condition. Abd pain Precautions Return to MAU for fever, chills, if unable to keep anything down for >24 hours or worsening pain.  Rx Phenergan, Ultram   Follow-up Information    Follow up with [facility] On 01/24/2015.   Contact information:   [place] [address] [phone]       Follow up with [facility].   Why:  As needed if symptoms worsen   Contact information:   [address] [id number] [place] [phone]        Medication List    TAKE these medications        acetaminophen 500 MG tablet  Commonly known as:  TYLENOL  Take 1,000 mg by mouth every 6 (six) hours as needed for mild pain or headache.     prenatal multivitamin Tabs tablet  Take 1 tablet by mouth daily at 12 noon.     promethazine 25 MG tablet  Commonly known as:  PHENERGAN  Take 1 tablet (25 mg total) by mouth every 6 (six) hours as needed for nausea or vomiting.     ranitidine 150 MG tablet  Commonly known as:  ZANTAC  Take 150 mg by mouth 2 (two) times daily.     traMADol 50 MG tablet  Commonly known as:  ULTRAM  Take 1-2 tablets (50-100 mg total) by mouth every 6 (six) hours as needed for severe pain.        [place], CNM 01/23/2015  10:42 AM

## 2015-01-23 NOTE — Discharge Instructions (Signed)
Abdominal Pain During Pregnancy Abdominal pain is common in pregnancy. Most of the time, it does not cause harm. There are many causes of abdominal pain. Some causes are more serious than others. Some of the causes of abdominal pain in pregnancy are easily diagnosed. Occasionally, the diagnosis takes time to understand. Other times, the cause is not determined. Abdominal pain can be a sign that something is very wrong with the pregnancy, or the pain may have nothing to do with the pregnancy at all. For this reason, always tell your health care provider if you have any abdominal discomfort. HOME CARE INSTRUCTIONS  Monitor your abdominal pain for any changes. The following actions may help to alleviate any discomfort you are experiencing:  Do not have sexual intercourse or put anything in your vagina until your symptoms go away completely.  Get plenty of rest until your pain improves.  Drink clear fluids if you feel nauseous. Avoid solid food as long as you are uncomfortable or nauseous.  Only take over-the-counter or prescription medicine as directed by your health care provider.  Keep all follow-up appointments with your health care provider. SEEK IMMEDIATE MEDICAL CARE IF:  You are bleeding, leaking fluid, or passing tissue from the vagina.  You have increasing pain or cramping.  You have persistent vomiting.  You have painful or bloody urination.  You have a fever.  You notice a decrease in your baby's movements.  You have extreme weakness or feel faint.  You have shortness of breath, with or without abdominal pain.  You develop a severe headache with abdominal pain.  You have abnormal vaginal discharge with abdominal pain.  You have persistent diarrhea.  You have abdominal pain that continues even after rest, or gets worse. MAKE SURE YOU:   Understand these instructions.  Will watch your condition.  Will get help right away if you are not doing well or get worse.     This information is not intended to replace advice given to you by your health care provider. Make sure you discuss any questions you have with your health care provider.   Document Released: 02/10/2005 Document Revised: 12/01/2012 Document Reviewed: 09/09/2012 Elsevier Interactive Patient Education 2016 [employer].  Viral Gastroenteritis Viral gastroenteritis is also known as stomach flu. This condition affects the stomach and intestinal tract. It can cause sudden diarrhea and vomiting. The illness typically lasts 3 to 8 days. Most people develop an immune response that eventually gets rid of the virus. While this natural response develops, the virus can make you quite ill. CAUSES  Many different viruses can cause gastroenteritis, such as rotavirus or noroviruses. You can catch one of these viruses by consuming contaminated food or water. You may also catch a virus by sharing utensils or other personal items with an infected person or by touching a contaminated surface. SYMPTOMS  The most common symptoms are diarrhea and vomiting. These problems can cause a severe loss of body fluids (dehydration) and a body salt (electrolyte) imbalance. Other symptoms may include:  Fever.  Headache.  Fatigue.  Abdominal pain. DIAGNOSIS  Your caregiver can usually diagnose viral gastroenteritis based on your symptoms and a physical exam. A stool sample may also be taken to test for the presence of viruses or other infections. TREATMENT  This illness typically goes away on its own. Treatments are aimed at rehydration. The most serious cases of viral gastroenteritis involve vomiting so severely that you are not able to keep fluids down. In these cases, fluids must  be given through an intravenous line (IV). HOME CARE INSTRUCTIONS   Drink enough fluids to keep your urine clear or pale yellow. Drink small amounts of fluids frequently and increase the amounts as tolerated.  Ask your caregiver for specific  rehydration instructions.  Avoid:  Foods high in sugar.  Alcohol.  Carbonated drinks.  Tobacco.  Juice.  Caffeine drinks.  Extremely hot or cold fluids.  Fatty, greasy foods.  Too much intake of anything at one time.  Dairy products until 24 to 48 hours after diarrhea stops.  You may consume probiotics. Probiotics are active cultures of beneficial bacteria. They may lessen the amount and number of diarrheal stools in adults. Probiotics can be found in yogurt with active cultures and in supplements.  Wash your hands well to avoid spreading the virus.  Only take over-the-counter or prescription medicines for pain, discomfort, or fever as directed by your caregiver. Do not give aspirin to children. Antidiarrheal medicines are not recommended.  Ask your caregiver if you should continue to take your regular prescribed and over-the-counter medicines.  Keep all follow-up appointments as directed by your caregiver. SEEK IMMEDIATE MEDICAL CARE IF:   You are unable to keep fluids down.  You do not urinate at least once every 6 to 8 hours.  You develop shortness of breath.  You notice blood in your stool or vomit. This may look like coffee grounds.  You have abdominal pain that increases or is concentrated in one small area (localized).  You have persistent vomiting or diarrhea.  You have a fever.  The patient is a child younger than 3 months, and he or she has a fever.  The patient is a child older than 3 months, and he or she has a fever and persistent symptoms.  The patient is a child older than 3 months, and he or she has a fever and symptoms suddenly get worse.  The patient is a baby, and he or she has no tears when crying. MAKE SURE YOU:   Understand these instructions.  Will watch your condition.  Will get help right away if you are not doing well or get worse.   This information is not intended to replace advice given to you by your health care provider.  Make sure you discuss any questions you have with your health care provider.   Document Released: 02/10/2005 Document Revised: 05/05/2011 Document Reviewed: 11/27/2010 Elsevier Interactive Patient Education [employer].

## 2015-01-23 NOTE — MAU Note (Signed)
Pt states she had heartburn last night, took zantac, but was unrelieved so took an additional 150 mg zantac @ approx 0130.  Had 3 diarrhea stools, still had abd pressure.  Sharp upper abd pain started around 0400.  Is still in pain now.  Denies lower abd pain or bleeding.

## 2015-02-25 NOTE — L&D Delivery Note (Signed)
Delivery Note   Requested by Dr. Philis Pique to attend this  repeat  C-section delivery at 39&6/[redacted]weeks GA due to latent phase labor. C-section was planned for 5/5.  Born to a G6P1 GBS status unknown, O negative mother with PNC.  Pregnancy complicated by anxiety, depression, ADD, heart murmur, cyst of cervix, chlamydia, hx of varicella, hx of alcohol abuse.   Intrapartum course complicated by previous C-section delivery. ROM occurred at delivery with clear fluid.   Infant vigorous with good spontaneous cry.  Routine NRP followed including warming, drying and stimulation.  Apgars 9 and 10.  Physical exam within normal limits.   Left in OR for skin-to-skin contact with mother, in care of CN staff.  Care transferred to Pediatrician.  Broady Lafoy A. Chana Bode, NNP-BC

## 2015-05-30 LAB — OB RESULTS CONSOLE GBS: STREP GROUP B AG: NEGATIVE

## 2015-06-21 ENCOUNTER — Encounter (HOSPITAL_COMMUNITY): Payer: Self-pay | Admitting: *Deleted

## 2015-06-21 ENCOUNTER — Telehealth (HOSPITAL_COMMUNITY): Payer: Self-pay | Admitting: *Deleted

## 2015-06-21 NOTE — Telephone Encounter (Signed)
Preadmission screen  

## 2015-06-25 ENCOUNTER — Encounter (HOSPITAL_COMMUNITY): Payer: Self-pay | Admitting: Anesthesiology

## 2015-06-28 ENCOUNTER — Encounter (HOSPITAL_COMMUNITY)
Admission: AD | Disposition: A | Discharge status: Home / Self Care | Payer: Self-pay | Source: Ambulatory Visit | Attending: Obstetrics and Gynecology

## 2015-06-28 ENCOUNTER — Inpatient Hospital Stay (HOSPITAL_COMMUNITY): Payer: Medicaid Other | Admitting: Anesthesiology

## 2015-06-28 ENCOUNTER — Encounter (HOSPITAL_COMMUNITY)
Admission: RE | Admit: 2015-06-28 | Discharge: 2015-06-28 | Disposition: A | Discharge status: Home / Self Care | Payer: Medicaid Other | Source: Ambulatory Visit

## 2015-06-28 ENCOUNTER — Encounter (HOSPITAL_COMMUNITY): Payer: Self-pay | Admitting: *Deleted

## 2015-06-28 ENCOUNTER — Inpatient Hospital Stay (HOSPITAL_COMMUNITY)
Admission: AD | Admit: 2015-06-28 | Discharge: 2015-07-01 | DRG: 765 | Disposition: A | Discharge status: Home / Self Care | Payer: Medicaid Other | Source: Ambulatory Visit | Attending: Obstetrics and Gynecology | Admitting: Obstetrics and Gynecology

## 2015-06-28 DIAGNOSIS — Z6841 Body Mass Index (BMI) 40.0 and over, adult: Secondary | ICD-10-CM

## 2015-06-28 DIAGNOSIS — O34219 Maternal care for unspecified type scar from previous cesarean delivery: Secondary | ICD-10-CM

## 2015-06-28 DIAGNOSIS — F418 Other specified anxiety disorders: Secondary | ICD-10-CM | POA: Diagnosis present

## 2015-06-28 DIAGNOSIS — D225 Melanocytic nevi of trunk: Secondary | ICD-10-CM | POA: Diagnosis present

## 2015-06-28 DIAGNOSIS — Z3A39 39 weeks gestation of pregnancy: Secondary | ICD-10-CM

## 2015-06-28 DIAGNOSIS — O99344 Other mental disorders complicating childbirth: Secondary | ICD-10-CM | POA: Diagnosis present

## 2015-06-28 DIAGNOSIS — Z87891 Personal history of nicotine dependence: Secondary | ICD-10-CM

## 2015-06-28 DIAGNOSIS — O34211 Maternal care for low transverse scar from previous cesarean delivery: Secondary | ICD-10-CM | POA: Diagnosis present

## 2015-06-28 DIAGNOSIS — O99214 Obesity complicating childbirth: Secondary | ICD-10-CM | POA: Diagnosis present

## 2015-06-28 HISTORY — PX: MOLE REMOVAL: SHX2046

## 2015-06-28 LAB — ABO/RH: ABO/RH(D): O NEG

## 2015-06-28 LAB — CBC
HCT: 34.5 % — ABNORMAL LOW (ref 36.0–46.0)
HEMOGLOBIN: 11.2 g/dL — AB (ref 12.0–15.0)
MCH: 26.8 pg (ref 26.0–34.0)
MCHC: 32.5 g/dL (ref 30.0–36.0)
MCV: 82.5 fL (ref 78.0–100.0)
PLATELETS: 193 10*3/uL (ref 150–400)
RBC: 4.18 MIL/uL (ref 3.87–5.11)
RDW: 15.8 % — ABNORMAL HIGH (ref 11.5–15.5)
WBC: 13.2 10*3/uL — ABNORMAL HIGH (ref 4.0–10.5)

## 2015-06-28 LAB — TYPE AND SCREEN
ABO/RH(D): O NEG
Antibody Screen: NEGATIVE

## 2015-06-28 LAB — RPR: RPR Ser Ql: NONREACTIVE

## 2015-06-28 SURGERY — Surgical Case
Anesthesia: Regional

## 2015-06-28 MED ORDER — BUPIVACAINE HCL 0.25 % IJ SOLN
INTRAMUSCULAR | Status: DC | PRN
  Administered 2015-06-28: 30 mL

## 2015-06-28 MED ORDER — ACETAMINOPHEN 500 MG PO TABS: 1000.0000 mg | ORAL_TABLET | Freq: Four times a day (QID) | ORAL | Status: DC

## 2015-06-28 MED ORDER — FAMOTIDINE IN NACL 20-0.9 MG/50ML-% IV SOLN
20.0000 mg | Freq: Once | INTRAVENOUS | Status: AC
  Administered 2015-06-28: 20 mg via INTRAVENOUS
  Filled 2015-06-28: qty 50

## 2015-06-28 MED ORDER — CEFAZOLIN SODIUM-DEXTROSE 2-4 GM/100ML-% IV SOLN
2.0000 g | INTRAVENOUS | Status: AC
  Administered 2015-06-28: 2 g via INTRAVENOUS
  Filled 2015-06-28: qty 100

## 2015-06-28 MED ORDER — MEASLES, MUMPS & RUBELLA VAC ~~LOC~~ INJ: 0.5000 mL | INJECTION | Freq: Once | SUBCUTANEOUS | Status: DC

## 2015-06-28 MED ORDER — KETOROLAC TROMETHAMINE 30 MG/ML IJ SOLN: 30.0000 mg | Freq: Four times a day (QID) | INTRAMUSCULAR | Status: AC | PRN

## 2015-06-28 MED ORDER — KETOROLAC TROMETHAMINE 30 MG/ML IJ SOLN
INTRAMUSCULAR | Status: AC
  Filled 2015-06-28: qty 1

## 2015-06-28 MED ORDER — OXYTOCIN 10 UNIT/ML IJ SOLN: 2.5000 [IU]/h | INTRAVENOUS | Status: AC

## 2015-06-28 MED ORDER — ACETAMINOPHEN 500 MG PO TABS
1000.0000 mg | ORAL_TABLET | Freq: Four times a day (QID) | ORAL | Status: AC
  Administered 2015-06-28 – 2015-06-29 (×2): 1000 mg via ORAL
  Filled 2015-06-28 (×3): qty 2

## 2015-06-28 MED ORDER — DIPHENHYDRAMINE HCL 25 MG PO CAPS: 25.0000 mg | ORAL_CAPSULE | Freq: Four times a day (QID) | ORAL | Status: DC | PRN

## 2015-06-28 MED ORDER — ONDANSETRON HCL 4 MG/2ML IJ SOLN: 4.0000 mg | Freq: Once | INTRAMUSCULAR | Status: DC | PRN

## 2015-06-28 MED ORDER — SIMETHICONE 80 MG PO CHEW
80.0000 mg | CHEWABLE_TABLET | Freq: Three times a day (TID) | ORAL | Status: DC
  Administered 2015-06-29 – 2015-07-01 (×6): 80 mg via ORAL
  Filled 2015-06-28 (×7): qty 1

## 2015-06-28 MED ORDER — DIPHENHYDRAMINE HCL 50 MG/ML IJ SOLN: 12.5000 mg | INTRAMUSCULAR | Status: DC | PRN

## 2015-06-28 MED ORDER — MEPERIDINE HCL 25 MG/ML IJ SOLN: 6.2500 mg | INTRAMUSCULAR | Status: DC | PRN

## 2015-06-28 MED ORDER — ONDANSETRON HCL 4 MG/2ML IJ SOLN: 4.0000 mg | Freq: Three times a day (TID) | INTRAMUSCULAR | Status: DC | PRN

## 2015-06-28 MED ORDER — IBUPROFEN 600 MG PO TABS
600.0000 mg | ORAL_TABLET | Freq: Four times a day (QID) | ORAL | Status: DC
  Administered 2015-06-28 – 2015-07-01 (×11): 600 mg via ORAL
  Filled 2015-06-28 (×11): qty 1

## 2015-06-28 MED ORDER — ACETAMINOPHEN 325 MG PO TABS: 650.0000 mg | ORAL_TABLET | ORAL | Status: DC | PRN

## 2015-06-28 MED ORDER — LACTATED RINGERS IV SOLN
INTRAVENOUS | Status: DC | PRN
  Administered 2015-06-28: 12:00:00 via INTRAVENOUS
  Administered 2015-06-28: 11:00:00
  Administered 2015-06-28 (×3): via INTRAVENOUS

## 2015-06-28 MED ORDER — LIDOCAINE-EPINEPHRINE (PF) 2 %-1:200000 IJ SOLN
INTRAMUSCULAR | Status: DC | PRN
  Administered 2015-06-28 (×4): 5 mL via EPIDURAL

## 2015-06-28 MED ORDER — DIPHENHYDRAMINE HCL 25 MG PO CAPS
25.0000 mg | ORAL_CAPSULE | ORAL | Status: DC | PRN
  Filled 2015-06-28: qty 1

## 2015-06-28 MED ORDER — KETAMINE HCL 10 MG/ML IJ SOLN
INTRAMUSCULAR | Status: DC | PRN
  Administered 2015-06-28 (×2): 20 mg via INTRAVENOUS

## 2015-06-28 MED ORDER — OXYCODONE HCL 5 MG PO TABS
5.0000 mg | ORAL_TABLET | ORAL | Status: DC | PRN
  Administered 2015-07-01: 5 mg via ORAL
  Filled 2015-06-28: qty 1

## 2015-06-28 MED ORDER — SIMETHICONE 80 MG PO CHEW
80.0000 mg | CHEWABLE_TABLET | ORAL | Status: DC
  Administered 2015-06-28 – 2015-06-30 (×3): 80 mg via ORAL
  Filled 2015-06-28 (×3): qty 1

## 2015-06-28 MED ORDER — COCONUT OIL OIL: 1.0000 "application " | TOPICAL_OIL | Status: DC | PRN

## 2015-06-28 MED ORDER — DIPHENHYDRAMINE HCL 25 MG PO CAPS: 25.0000 mg | ORAL_CAPSULE | ORAL | Status: DC | PRN

## 2015-06-28 MED ORDER — NALBUPHINE HCL 10 MG/ML IJ SOLN: 5.0000 mg | Freq: Once | INTRAMUSCULAR | Status: DC | PRN

## 2015-06-28 MED ORDER — FERROUS SULFATE 325 (65 FE) MG PO TABS
325.0000 mg | ORAL_TABLET | Freq: Two times a day (BID) | ORAL | Status: DC
  Administered 2015-06-29 – 2015-07-01 (×5): 325 mg via ORAL
  Filled 2015-06-28 (×5): qty 1

## 2015-06-28 MED ORDER — ZOLPIDEM TARTRATE 5 MG PO TABS: 5.0000 mg | ORAL_TABLET | Freq: Every evening | ORAL | Status: DC | PRN

## 2015-06-28 MED ORDER — FENTANYL CITRATE (PF) 100 MCG/2ML IJ SOLN: 25.0000 ug | INTRAMUSCULAR | Status: DC | PRN

## 2015-06-28 MED ORDER — NALBUPHINE HCL 10 MG/ML IJ SOLN
5.0000 mg | INTRAMUSCULAR | Status: DC | PRN
  Administered 2015-06-28: 5 mg via INTRAVENOUS
  Filled 2015-06-28 (×2): qty 1

## 2015-06-28 MED ORDER — NALOXONE HCL 0.4 MG/ML IJ SOLN: 0.4000 mg | INTRAMUSCULAR | Status: DC | PRN

## 2015-06-28 MED ORDER — FLEET ENEMA 7-19 GM/118ML RE ENEM: 1.0000 | ENEMA | Freq: Every day | RECTAL | Status: DC | PRN

## 2015-06-28 MED ORDER — METHYLERGONOVINE MALEATE 0.2 MG PO TABS: 0.2000 mg | ORAL_TABLET | ORAL | Status: DC | PRN

## 2015-06-28 MED ORDER — FAMOTIDINE 20 MG PO TABS
20.0000 mg | ORAL_TABLET | Freq: Two times a day (BID) | ORAL | Status: DC
  Administered 2015-06-28 – 2015-06-30 (×5): 20 mg via ORAL
  Filled 2015-06-28 (×5): qty 1

## 2015-06-28 MED ORDER — MORPHINE SULFATE (PF) 0.5 MG/ML IJ SOLN
INTRAMUSCULAR | Status: DC | PRN
  Administered 2015-06-28: 4 mg via EPIDURAL

## 2015-06-28 MED ORDER — OXYCODONE HCL 5 MG PO TABS
10.0000 mg | ORAL_TABLET | ORAL | Status: DC | PRN
Start: 2015-06-28 — End: 2015-07-01
  Administered 2015-06-30: 10 mg via ORAL
  Filled 2015-06-28: qty 2

## 2015-06-28 MED ORDER — PRENATAL MULTIVITAMIN CH
1.0000 | ORAL_TABLET | Freq: Every day | ORAL | Status: DC
  Administered 2015-06-29 – 2015-07-01 (×3): 1 via ORAL
  Filled 2015-06-28 (×3): qty 1

## 2015-06-28 MED ORDER — NALBUPHINE HCL 10 MG/ML IJ SOLN: 5.0000 mg | INTRAMUSCULAR | Status: DC | PRN

## 2015-06-28 MED ORDER — BISACODYL 10 MG RE SUPP: 10.0000 mg | Freq: Every day | RECTAL | Status: DC | PRN

## 2015-06-28 MED ORDER — NALOXONE HCL 2 MG/2ML IJ SOSY: 1.0000 ug/kg/h | PREFILLED_SYRINGE | INTRAMUSCULAR | Status: DC | PRN

## 2015-06-28 MED ORDER — WITCH HAZEL-GLYCERIN EX PADS: 1.0000 "application " | MEDICATED_PAD | CUTANEOUS | Status: DC | PRN

## 2015-06-28 MED ORDER — LACTATED RINGERS IV SOLN
INTRAVENOUS | Status: DC
  Administered 2015-06-28 (×2): via INTRAVENOUS

## 2015-06-28 MED ORDER — SODIUM CHLORIDE 0.9% FLUSH: 3.0000 mL | INTRAVENOUS | Status: DC | PRN

## 2015-06-28 MED ORDER — NALBUPHINE HCL 10 MG/ML IJ SOLN
5.0000 mg | INTRAMUSCULAR | Status: DC | PRN
  Administered 2015-06-29: 5 mg via SUBCUTANEOUS

## 2015-06-28 MED ORDER — TETANUS-DIPHTH-ACELL PERTUSSIS 5-2.5-18.5 LF-MCG/0.5 IM SUSP: 0.5000 mL | Freq: Once | INTRAMUSCULAR | Status: DC

## 2015-06-28 MED ORDER — KETOROLAC TROMETHAMINE 30 MG/ML IJ SOLN: 30.0000 mg | Freq: Four times a day (QID) | INTRAMUSCULAR | Status: DC | PRN

## 2015-06-28 MED ORDER — MEPERIDINE HCL 25 MG/ML IJ SOLN
INTRAMUSCULAR | Status: DC | PRN
  Administered 2015-06-28 (×2): 12.5 mg via INTRAVENOUS

## 2015-06-28 MED ORDER — OXYTOCIN 10 UNIT/ML IJ SOLN
40.0000 [IU] | INTRAVENOUS | Status: DC | PRN
  Administered 2015-06-28: 40 [IU] via INTRAVENOUS

## 2015-06-28 MED ORDER — DIBUCAINE 1 % RE OINT: 1.0000 "application " | TOPICAL_OINTMENT | RECTAL | Status: DC | PRN

## 2015-06-28 MED ORDER — SCOPOLAMINE 1 MG/3DAYS TD PT72: 1.0000 | MEDICATED_PATCH | Freq: Once | TRANSDERMAL | Status: DC

## 2015-06-28 MED ORDER — MENTHOL 3 MG MT LOZG: 1.0000 | LOZENGE | OROMUCOSAL | Status: DC | PRN

## 2015-06-28 MED ORDER — SENNOSIDES-DOCUSATE SODIUM 8.6-50 MG PO TABS
2.0000 | ORAL_TABLET | ORAL | Status: DC
  Administered 2015-06-28 – 2015-06-30 (×3): 2 via ORAL
  Filled 2015-06-28 (×3): qty 2

## 2015-06-28 MED ORDER — SCOPOLAMINE 1 MG/3DAYS TD PT72
MEDICATED_PATCH | TRANSDERMAL | Status: AC
  Filled 2015-06-28: qty 1

## 2015-06-28 MED ORDER — MIDAZOLAM HCL 2 MG/2ML IJ SOLN
INTRAMUSCULAR | Status: DC | PRN
  Administered 2015-06-28 (×2): 1 mg via INTRAVENOUS

## 2015-06-28 MED ORDER — SIMETHICONE 80 MG PO CHEW
80.0000 mg | CHEWABLE_TABLET | ORAL | Status: DC | PRN
  Administered 2015-06-29: 80 mg via ORAL

## 2015-06-28 MED ORDER — BUPIVACAINE HCL (PF) 0.25 % IJ SOLN
INTRAMUSCULAR | Status: AC
  Filled 2015-06-28: qty 30

## 2015-06-28 MED ORDER — METHYLERGONOVINE MALEATE 0.2 MG/ML IJ SOLN: 0.2000 mg | INTRAMUSCULAR | Status: DC | PRN

## 2015-06-28 MED ORDER — CITRIC ACID-SODIUM CITRATE 334-500 MG/5ML PO SOLN
30.0000 mL | Freq: Once | ORAL | Status: AC
  Administered 2015-06-28: 30 mL via ORAL
  Filled 2015-06-28: qty 15

## 2015-06-28 MED ORDER — NALOXONE HCL 2 MG/2ML IJ SOSY
1.0000 ug/kg/h | PREFILLED_SYRINGE | INTRAMUSCULAR | Status: DC | PRN
  Filled 2015-06-28: qty 2

## 2015-06-28 SURGICAL SUPPLY — 34 items
BENZOIN TINCTURE PRP APPL 2/3 (GAUZE/BANDAGES/DRESSINGS) ×3 IMPLANT
BLADE 11 SAFETY STRL DISP (BLADE) ×3 IMPLANT
CLAMP CORD UMBIL (MISCELLANEOUS) IMPLANT
CLOTH BEACON ORANGE TIMEOUT ST (SAFETY) ×3 IMPLANT
DRSG OPSITE POSTOP 4X10 (GAUZE/BANDAGES/DRESSINGS) ×3 IMPLANT
DURAPREP 26ML APPLICATOR (WOUND CARE) ×3 IMPLANT
ELECT REM PT RETURN 9FT ADLT (ELECTROSURGICAL) ×3
ELECTRODE REM PT RTRN 9FT ADLT (ELECTROSURGICAL) ×2 IMPLANT
EXTRACTOR VACUUM BELL STYLE (SUCTIONS) IMPLANT
GLOVE BIO SURGEON STRL SZ7 (GLOVE) ×3 IMPLANT
GLOVE BIOGEL PI IND STRL 7.0 (GLOVE) ×2 IMPLANT
GLOVE BIOGEL PI INDICATOR 7.0 (GLOVE) ×1
GOWN STRL REUS W/TWL LRG LVL3 (GOWN DISPOSABLE) ×6 IMPLANT
KIT ABG SYR 3ML LUER SLIP (SYRINGE) IMPLANT
NEEDLE HYPO 22GX1.5 SAFETY (NEEDLE) ×3 IMPLANT
NEEDLE HYPO 25X5/8 SAFETYGLIDE (NEEDLE) IMPLANT
NS IRRIG 1000ML POUR BTL (IV SOLUTION) ×3 IMPLANT
PACK C SECTION WH (CUSTOM PROCEDURE TRAY) ×3 IMPLANT
PAD OB MATERNITY 4.3X12.25 (PERSONAL CARE ITEMS) ×3 IMPLANT
PENCIL SMOKE EVAC W/HOLSTER (ELECTROSURGICAL) ×3 IMPLANT
RTRCTR C-SECT PINK 25CM LRG (MISCELLANEOUS) ×3 IMPLANT
STRIP CLOSURE SKIN 1/2X4 (GAUZE/BANDAGES/DRESSINGS) ×3 IMPLANT
SUT MNCRL 0 VIOLET CTX 36 (SUTURE) ×6 IMPLANT
SUT MONOCRYL 0 CTX 36 (SUTURE) ×3
SUT PDS AB 0 CTX 60 (SUTURE) IMPLANT
SUT PLAIN 2 0 XLH (SUTURE) IMPLANT
SUT VIC AB 0 CT1 27 (SUTURE) ×2
SUT VIC AB 0 CT1 27XBRD ANBCTR (SUTURE) ×4 IMPLANT
SUT VIC AB 2-0 CT1 27 (SUTURE) ×1
SUT VIC AB 2-0 CT1 TAPERPNT 27 (SUTURE) ×2 IMPLANT
SUT VIC AB 3-0 SH 27 (SUTURE) ×1
SUT VIC AB 3-0 SH 27X BRD (SUTURE) ×2 IMPLANT
TOWEL OR 17X24 6PK STRL BLUE (TOWEL DISPOSABLE) ×3 IMPLANT
TRAY FOLEY CATH SILVER 14FR (SET/KITS/TRAYS/PACK) ×3 IMPLANT

## 2015-06-28 NOTE — Op Note (Signed)
Patient Nicole Robinson  PRE-OPERATIVE DIAGNOSIS: Labor, desires repeat c/s; abdominal mole  POST-OPERATIVE DIAGNOSIS: same  PROCEDURE: Procedure(s): CESAREAN SECTION (N/A), mole removal  SURGEON: Surgeon(s) and Role:  * Bobbye Charleston, MD - Primary  ASSISTANTS: Adair Patter   ANESTHESIA: epidural  EBL: normal loss  LOCAL MEDICATIONS USED: OTHER marcaine  SPECIMEN: No Specimen  DISPOSITION OF SPECIMEN: PATHOLOGY  COUNTS: YES  TOURNIQUET: * No tourniquets in log *  DICTATION: .Note written in EPIC  PLAN OF CARE: Admit to inpatient   PATIENT DISPOSITION: PACU - hemodynamically stable.  Delay start of Pharmacological VTE agent (>24hrs) due to surgical blood loss or risk of bleeding: not applicable  Complications: none Medications: Ancef, Pitocin Findings: Baby female, Apgars 9,10, weight P. Normal tubes, ovaries and uterus seen. Baby was skin to skin with mother after birth in the OR.  Technique:  After adequate epidural anesthesia was achieved, the patient was prepped and draped in usual sterile fashion. A foley catheter was used to drain the bladder. A pfannanstiel incision was made with the scalpel and carried down to the fascia with the bovie cautery. The fascia was incised in the midline with the scalpel and carried in a transverse curvilinear manner bilaterally. The fascia was reflected superiorly and inferiorly off the rectus muscles and the muscles split in the midline. A bowel free portion of the peritoneum was entered bluntly and then extended in a superior and inferior manner with good visualization of the bowel and bladder. The Alexis instrument was then placed and the vesico-uterine fascia tented up and incised in a transverse curvilinear manner. A 2 cm transverse incision was made in the upper portion of the lower uterine segment until the amnion was exposed. The incision was extended transversely in a blunt manner.  Clear fluid was noted and the baby delivered in the vertex presentation without complication. The baby was bulb suctioned and the cord was clamped and cut. The baby was then handed to awaiting Neonatology. The placenta was then delivered manually and the uterus cleared of all debris. The uterine incision was then closed with a running lock stitch of 0 monocryl. An imbricating layer of 0 monocryl was closed as well. Excellent hemostasis of the uterine incision was achieved and the abdomen was cleared with irrigation. The peritoneum was closed with a running stitch of 2-0 vicryl. This incorporated the rectus muscles as a separate layer. The fascia was then closed with a running stitch of 0 vicryl. The subcutaneous layer was closed with interrupted stitches of 2-0 plain gut. The skin was closed with 4-0 vicryl on a Keith needle and steri-strips.   Attention was turned to the R side of the abdomen where the pt had a raised rough mole that appeared to be a keratosis. This skin was still numb and the mole was tented up and removed with a scalpel. A single stitch of 2-0 vicryl was used to close the defect.  The patient tolerated the procedure well and was returned to the recovery room in stable condition.  All counts were correct times three.  Nicole Robinson A

## 2015-06-28 NOTE — Progress Notes (Signed)
Pt arrived to room with epidural in back.  Notified PACU Nurse Butch Penny.  She said she will come take it out.

## 2015-06-28 NOTE — Progress Notes (Signed)
Epidural line pulled in room 148 at 1515. Tip intact.

## 2015-06-28 NOTE — Transfer of Care (Signed)
Immediate Anesthesia Transfer of Care Note  Patient: Nicole Robinson  Procedure(s) Performed: Procedure(s) with comments: CESAREAN SECTION (N/A) MOLE REMOVAL - offof abdomen  Patient Location: PACU  Anesthesia Type:Epidural  Level of Consciousness: awake, alert  and oriented  Airway & Oxygen Therapy: Patient Spontanous Breathing  Post-op Assessment: Report given to RN and Post -op Vital signs reviewed and stable  Post vital signs: Reviewed and stable  Last Vitals:  Filed Vitals:   06/28/15 0622  BP: 129/78  Pulse: 82  Temp: 36.7 C  Resp: 20    Last Pain:  Filed Vitals:   06/28/15 0627  PainSc: 0         Complications: No apparent anesthesia complications

## 2015-06-28 NOTE — H&P (Signed)
35 y.o.  [id number] [redacted]w[redacted]d scheduled for a repeat cesarean section at term tomorrow comes in in latent phase labor today.  Although she has not changed her cervix, she is contracting every 3-8 and extremely uncomfortable with them.  Patient has good fetal movement and no bleeding.    Past Medical History  Diagnosis Date  . Anxiety   . Depression   . ADD (attention deficit disorder)   . Heart murmur   . Cyst of cervix   . Chlamydia   . Hx of varicella   . History of alcohol abuse   . Complication of anesthesia     "resistant to anesthesia"    Past Surgical History  Procedure Laterality Date  . Cesarean section    . Wisdom tooth extraction      OB History  Gravida Para Term Preterm AB SAB TAB Ectopic Multiple Living  6 2 1 1 3 1 2   2     # Outcome Date GA Lbr Len/2nd Weight Sex Delivery Anes PTL Lv  6 Current           5 Term 2007 [redacted]w[redacted]d  3.289 kg (7 lb 4 oz) M CS-LTranv   Y     Complications: Failure to Progress in First Stage     Comments: System Generated. Please review and update pregnancy details.  4 Preterm 2000 [redacted]w[redacted]d  2.523 kg (5 lb 9 oz) F Vag-Spont   Y  3 SAB           2 TAB           1 TAB               Social History   Social History  . Marital Status: Married    Spouse Name: N/A  . Number of Children: N/A  . Years of Education: N/A   Occupational History  . Not on file.   Social History Main Topics  . Smoking status: Former Smoker -- 0.25 packs/day    Quit date: 11/21/2014  . Smokeless tobacco: Not on file  . Alcohol Use: 3.3 oz/week    3 Glasses of wine, 3 Standard drinks or equivalent per week     Comment: occasional  . Drug Use: No  . Sexual Activity: Yes   Other Topics Concern  . Not on file   Social History Narrative   Other   Prenatal Course: uncomplicated.   Prenatal Transfer Tool  Maternal Diabetes: No Genetic Screening: Normal Maternal Ultrasounds/Referrals: Normal Fetal Ultrasounds or other Referrals:  None Maternal Substance Abuse:   Yes:  Type: Smoker Significant Maternal Medications:  None Significant Maternal Lab Results: None   Filed Vitals:   06/28/15 0622  BP: 129/78  Pulse: 82  Temp: 98.1 F (36.7 C)  Resp: 20     Lungs/Cor:  NAD Abdomen:  soft, gravid Ex:  no cords, erythema SVE:  NA FHTs:  NST R Toco q 3 SVE 1/70/-2  A/P   For repeat cesarean sectionat term.  All risks, benefits and alternatives discussed with patient and she desires to proceed.  Pt declines BTL.  Shrihaan Porzio A

## 2015-06-28 NOTE — Anesthesia Postprocedure Evaluation (Signed)
Anesthesia Post Note  Patient: Nicole Robinson  Procedure(s) Performed: Procedure(s) (LRB): CESAREAN SECTION (N/A) MOLE REMOVAL  Patient location during evaluation: PACU Anesthesia Type: Epidural Level of consciousness: awake and alert Pain management: pain level controlled Vital Signs Assessment: post-procedure vital signs reviewed and stable Respiratory status: spontaneous breathing, nonlabored ventilation, respiratory function stable and patient connected to nasal cannula oxygen Cardiovascular status: blood pressure returned to baseline and stable Postop Assessment: no signs of nausea or vomiting, patient able to bend at knees and epidural receding Anesthetic complications: no     Last Vitals:  Filed Vitals:   06/28/15 1415 06/28/15 1500  BP: 122/71 91/64  Pulse: 90 64  Temp:  36.6 C  Resp: 16 18    Last Pain:  Filed Vitals:   06/28/15 1503  PainSc: 0-No pain   Pain Goal:                 Nicole Robinson

## 2015-06-28 NOTE — Lactation Note (Signed)
This note was copied from a baby's chart. Lactation Consultation Note  Patient Name: [name] Today's Date: 06/28/2015 Reason for consult: Initial assessment Baby at 6 hr of life and mom reports baby is not latching. She stated she has tried 5-6 times since birth, since baby did not do well she has started bottles of formula. She did not bf her older children because the breast are "mentally sensitive" areas. Offered pumping as an option and she stated she really wants to try latching but if continues to unsuccessful she will try pumping. Discussed baby behavior, feeding frequency, baby belly size, voids, wt loss, breast changes, and nipple care. She stated she can manually express, has seen colostrum, and has a spoon in the room. Given lactation handouts. Aware of OP services and support group. She will call at next feeding to have help with latch.      Maternal Data Has patient been taught Hand Expression?: Yes Does the patient have breastfeeding experience prior to this delivery?: No  Feeding Feeding Type: Bottle Fed - Formula  LATCH Score/Interventions                      Lactation Tools Discussed/Used WIC Program: Yes   Consult Status Consult Status: Follow-up Date: 06/29/15 Follow-up type: In-patient    Denzil Hughes 06/28/2015, 6:52 PM

## 2015-06-28 NOTE — Anesthesia Preprocedure Evaluation (Addendum)
Anesthesia Evaluation  Patient identified by MRN, date of birth, ID band Patient awake    Reviewed: Allergy & Precautions, NPO status , Patient's Chart, lab work & pertinent test results  History of Anesthesia Complications (+) history of anesthetic complications (reports being "intolerent to local anesthetic")  Airway Mallampati: II  TM Distance: >3 FB Neck ROM: Full    Dental no notable dental hx. (+) Dental Advisory Given   Pulmonary former smoker,    Pulmonary exam normal breath sounds clear to auscultation       Cardiovascular negative cardio ROS Normal cardiovascular exam Rhythm:Regular Rate:Normal     Neuro/Psych PSYCHIATRIC DISORDERS Anxiety Depression negative neurological ROS     GI/Hepatic negative GI ROS, Neg liver ROS,   Endo/Other  Morbid obesity  Renal/GU negative Renal ROS  negative genitourinary   Musculoskeletal negative musculoskeletal ROS (+)   Abdominal   Peds negative pediatric ROS (+)  Hematology negative hematology ROS (+)   Anesthesia Other Findings   Reproductive/Obstetrics (+) Pregnancy                            Anesthesia Physical Anesthesia Plan  ASA: III  Anesthesia Plan: Combined Spinal and Epidural   Post-op Pain Management:    Induction:   Airway Management Planned:   Additional Equipment:   Intra-op Plan:   Post-operative Plan:   Informed Consent: I have reviewed the patients History and Physical, chart, labs and discussed the procedure including the risks, benefits and alternatives for the proposed anesthesia with the patient or authorized representative who has indicated his/her understanding and acceptance.   Dental advisory given  Plan Discussed with: CRNA  Anesthesia Plan Comments: (She reports that with a tooth extraction they were never able to get her all the way numb and she required excessive amount of local anesthetic. She  also reports that in [place] with her first C/S, she only partially got numb and required such deep sedation that she does not remember anything from that birth. She reports that she never had an ETT but had to be heavily sedated because she was not numb enough. )       Anesthesia Quick Evaluation

## 2015-06-28 NOTE — MAU Note (Signed)
Pt c/o contractions since 2am every 5-8 mins. Denies LOF or vag bleeding. +FM. Cervix was 1cm yesterday in office. Scheduled for c/s on 06/29/2015.

## 2015-06-28 NOTE — Anesthesia Procedure Notes (Signed)
Epidural Patient location during procedure: OR  Staffing Anesthesiologist: Nesa Distel Performed by: anesthesiologist   Preanesthetic Checklist Completed: patient identified, site marked, surgical consent, pre-op evaluation, timeout performed, IV checked, risks and benefits discussed and monitors and equipment checked  Epidural Patient position: sitting Prep: site prepped and draped and DuraPrep Patient monitoring: continuous pulse ox and blood pressure Approach: midline Location: L4-L5 Injection technique: LOR saline  Needle:  Needle type: Tuohy  Needle gauge: 17 G Needle length: 9 cm and 9 Needle insertion depth: 10 cm Catheter type: closed end flexible Catheter size: 19 Gauge Catheter at skin depth: 14 cm Test dose: negative  Assessment Events: blood not aspirated, injection not painful, no injection resistance, negative IV test and no paresthesia  Additional Notes Patient identified. Risks/Benefits/Options discussed with patient including but not limited to bleeding, infection, nerve damage, paralysis, failed block, incomplete pain control, headache, blood pressure changes, nausea, vomiting, reactions to medication both or allergic, itching and postpartum back pain. Confirmed with bedside nurse the patient's most recent platelet count. Confirmed with patient that they are not currently taking any anticoagulation, have any bleeding history or any family history of bleeding disorders. Patient expressed understanding and wished to proceed. All questions were answered. Sterile technique was used throughout the entire procedure. Please see nursing notes for vital signs. Test dose was given through epidural catheter and negative prior to continuing to dose epidural or start infusion. Warning signs of high block given to the patient including shortness of breath, tingling/numbness in hands, complete motor block, or any concerning symptoms with instructions to call for help. Patient was  given instructions on fall risk and not to get out of bed. All questions and concerns addressed with instructions to call with any issues or inadequate analgesia.    Patient was very upset and nervous prior to starting the procedure. She reports that she hates needles. Had to stop several times because the pressure of the tuohy was too much. She denies sharp pain. Additional local given. LOR obtained but reported paresthesia on L when spinal needle advanced. Stopped and withdrew needle. Repositioned tuohy and LOR again obtained but reported paresthesia again. Repositioned tuohy and decision to proceed with just a straight epidural at this time because she was not tolerating the procedure well or able to sit very still despite a dose of versed which she requested. Catheter thread very easily, no paresthesias, no heme, no CSF return.

## 2015-06-28 NOTE — Lactation Note (Signed)
This note was copied from a baby's chart. Lactation Consultation Note  Patient Name: Nicole Robinson [id number] Date: 06/28/2015 Reason for consult: Follow-up assessment Baby at 7 hr of life. Mom called for help latching baby. By the time lactation arrived mom had already fed 10 ml of formula. Baby was alert and mom wanted to try latch. Mom has large soft breast with a short shaft nipple. Baby could latch but would slide off. Mom is not good at supporting her breast, she lays back and wants lactation to do it. Applied #20 NS and baby was able to maintain suction. Mom denies breast or nipple pain. The 50 yr old sister is in the room saying "OOO, mom bf is gross. That baby don't want your nasty titties. I can't believe you are doing that." Tried to talk through bf with her and she seemed to ease up on her mom a little. Mom will offer the breast on demand 8+/24 hr. She will try latch without the NS and if baby will not stay on she will try with the NS before offering formula.      Maternal Data Has patient been taught Hand Expression?: Yes Does the patient have breastfeeding experience prior to this delivery?: No  Feeding Feeding Type: Bottle Fed - Formula Nipple Type: Slow - flow  LATCH Score/Interventions Latch: Repeated attempts needed to sustain latch, nipple held in mouth throughout feeding, stimulation needed to elicit sucking reflex. Intervention(s): Adjust position;Assist with latch;Breast compression  Audible Swallowing: A few with stimulation Intervention(s): Skin to skin;Hand expression  Type of Nipple: Everted at rest and after stimulation  Comfort (Breast/Nipple): Soft / non-tender     Hold (Positioning): Full assist, staff holds infant at breast Intervention(s): Support Pillows;Position options  LATCH Score: 6  Lactation Tools Discussed/Used Tools: Nipple Shields Nipple shield size: 20 WIC Program: Yes   Consult Status Consult Status: Follow-up Date:  06/29/15 Follow-up type: [place] 06/28/2015, 7:45 PM

## 2015-06-29 ENCOUNTER — Inpatient Hospital Stay (HOSPITAL_COMMUNITY): Admission: RE | Admit: 2015-06-29 | Payer: Medicaid Other | Source: Ambulatory Visit | Admitting: Obstetrics

## 2015-06-29 ENCOUNTER — Encounter (HOSPITAL_COMMUNITY): Admission: RE | Payer: Self-pay | Source: Ambulatory Visit

## 2015-06-29 ENCOUNTER — Encounter (HOSPITAL_COMMUNITY): Payer: Self-pay | Admitting: *Deleted

## 2015-06-29 LAB — CBC
HCT: 28 % — ABNORMAL LOW (ref 36.0–46.0)
HEMOGLOBIN: 9 g/dL — AB (ref 12.0–15.0)
MCH: 26.5 pg (ref 26.0–34.0)
MCHC: 32.1 g/dL (ref 30.0–36.0)
MCV: 82.4 fL (ref 78.0–100.0)
PLATELETS: 178 10*3/uL (ref 150–400)
RBC: 3.4 MIL/uL — AB (ref 3.87–5.11)
RDW: 15.7 % — ABNORMAL HIGH (ref 11.5–15.5)
WBC: 12.7 10*3/uL — AB (ref 4.0–10.5)

## 2015-06-29 SURGERY — Surgical Case
Anesthesia: Regional

## 2015-06-29 MED ORDER — RHO D IMMUNE GLOBULIN 1500 UNIT/2ML IJ SOSY
300.0000 ug | PREFILLED_SYRINGE | Freq: Once | INTRAMUSCULAR | Status: AC
  Administered 2015-06-29: 300 ug via INTRAVENOUS
  Filled 2015-06-29: qty 2

## 2015-06-29 NOTE — Progress Notes (Signed)
POD# 1 Pt without complaints. HGB-9 VSSAF IMP/ Stable Plan/ routine care.

## 2015-06-29 NOTE — Anesthesia Postprocedure Evaluation (Signed)
Anesthesia Post Note  Patient: Nicole Robinson  Procedure(s) Performed: Procedure(s) (LRB): CESAREAN SECTION (N/A) MOLE REMOVAL  Patient location during evaluation: Mother Baby Anesthesia Type: Epidural Level of consciousness: awake and alert Pain management: satisfactory to patient Vital Signs Assessment: post-procedure vital signs reviewed and stable Respiratory status: respiratory function stable Cardiovascular status: stable Postop Assessment: no headache, no backache, epidural receding, patient able to bend at knees, no signs of nausea or vomiting and adequate PO intake Anesthetic complications: no Comments: Comfort level was assessed by AnesthesiaTeam and the patient was pleased with the care, interventions, and services provided by the Department of Anesthesia.     Last Vitals:  Filed Vitals:   06/29/15 0000 06/29/15 0356  BP: 126/65 115/58  Pulse: 72 69  Temp:  36.8 C  Resp: 18 18    Last Pain:  Filed Vitals:   06/29/15 0557  PainSc: 4    Pain Goal: Patients Stated Pain Goal: 3 (06/29/15 0556)               Katherina Mires

## 2015-06-29 NOTE — Addendum Note (Signed)
Addendum  created 06/29/15 [id number] by Flossie Dibble, CRNA   Modules edited: Notes Section   Notes Section:  File: [id number]; File: [id number]

## 2015-06-29 NOTE — Anesthesia Post-op Follow-up Note (Cosign Needed)
  Anesthesia Pain Follow-up Note  Patient: Nicole Robinson  Day #: 1  Date of Follow-up: 06/29/2015 Time: 9:09 AM  Last Vitals:  Filed Vitals:   06/29/15 0000 06/29/15 0356  BP: 126/65 115/58  Pulse: 72 69  Temp:  36.8 C  Resp: 18 18    Level of Consciousness: alert  Pain: 3 /10   Side Effects:Pruritis  Catheter Site Exam:healing  Plan: D/C from anesthesia care  Judi Jaffe

## 2015-06-29 NOTE — Lactation Note (Signed)
This note was copied from a baby's chart. Lactation Consultation Note  Patient Name: Nicole Robinson [id number] Date: 06/29/2015 Reason for consult: Follow-up assessment  Baby 45 hours old. Mom reports that she has been giving a lot of bottles but would like assistance with latching the baby. Mom reports that she did not nurse her older 2 children, but feels guilty after attending [place] and so wants to at least try to nurse this baby. Assisted mom to latch baby in both football and cross-cradle position. This LC able to get baby latched after several attempts, and baby latched deeply, and suckled rhythmically with a few swallows noted. Mom able to hand express with colostrum present. However, mom had a lot of trouble latching baby. Assisted mom to support her pendulous breast with rolled cloth diaper. Also demonstrated positioning with pillows and enc mom to call out for assistance with latching as needed. Discussed with mom that baby is used to the flow of the bottle, and is not as willing to keep nursing with such a difference in milk flow at the breast. Enc mom to put baby to breast first with each feeding and then give bottle if baby not satisfied. Discussed not overfeeding with formula so baby will continue to nurse. Discussed assessment and interventions with patient's nurse, Gilmore Laroche, RN.   Maternal Data    Feeding Feeding Type: Breast Fed Length of feed:  (LC assessed first 10 minutes of BF. )  LATCH Score/Interventions Latch: Repeated attempts needed to sustain latch, nipple held in mouth throughout feeding, stimulation needed to elicit sucking reflex. Intervention(s): Adjust position;Assist with latch;Breast compression  Audible Swallowing: A few with stimulation Intervention(s): Skin to skin;Hand expression  Type of Nipple: Everted at rest and after stimulation (Short shaft. )  Comfort (Breast/Nipple): Soft / non-tender     Hold (Positioning): Assistance needed to correctly  position infant at breast and maintain latch. Intervention(s): Breastfeeding basics reviewed;Support Pillows;Skin to skin  LATCH Score: 7  Lactation Tools Discussed/Used     Consult Status Consult Status: Follow-up Date: 06/30/15 Follow-up type: In-patient    [name] 06/29/2015, 3:32 PM

## 2015-06-30 LAB — RH IG WORKUP (INCLUDES ABO/RH)
ABO/RH(D): O NEG
FETAL SCREEN: NEGATIVE
Gestational Age(Wks): 39.3
Unit division: 0

## 2015-06-30 NOTE — Progress Notes (Signed)
POD#2 Pt c/o stiffness and discomfort with movement VSSAF ABD non tender IMP/ Stable Plan/ Routine care

## 2015-06-30 NOTE — Clinical Social Work Maternal (Signed)
  CLINICAL SOCIAL WORK MATERNAL/CHILD NOTE  Patient Details  Name: Nicole Robinson MRN: [id number] Date of Birth: 04-11-1980  Date:  06/30/2015  Clinical Social Worker Initiating Note:  Loren Racer, [place] Date/ Time Initiated:  06/30/15/1638     Child's Name:  Nicole Robinson   Legal Guardian:  Mother  FOB Peggye Ley is involved, but per [name] is bipolar and not receiving MH assistance. They are not currently together, he has assisted with preps for baby.   Need for Interpreter:  None   Date of Referral:  06/28/15     Reason for Referral:  Other (Comment)  H/O anxiety and anger in past prior to pregnancy per [name].   Referral Source:  [facility]   Address:  [place], [place] [address]  Phone number:  [phone]   Household Members:  Self, Minor Children 37 yo and 9yo siblings in the home.   Natural Supports (not living in the home):  Extended Family, Spouse/significant other   Professional Supports:   none noted  Employment: Animator   Type of Work: Scientist, water quality at [employer]   Education:  [employer] school Herbalist Resources:  [employer], employment  Other Resources:  Physicist, medical , Child Support   Cultural/Religious Considerations Which May Impact Care:  none  Strengths:  Ability to meet basic needs , Compliance with medical plan , Home prepared for child , Pediatrician chosen    Risk Factors/Current Problems:  None   Cognitive State:  Linear Thinking , Insightful , Able to Concentrate , Goal Oriented    Mood/Affect:  Interested , Bright , Happy    CSW Assessment:  [name] and CSW met in [name] room to assess needs due to h/o anxiety. [name] reports she was on Ativan in the past prior to this pregnancy. She denies any issues with anxiety or depression during this pregnancy. She is aware of how to reach out for assistance as needed. [name] reports she had several drinks which resulted in this pregnancy. Pt does not admit to ETOH abuse. [name] reports to  have all items in place for baby and appears to be bonding well with baby. She seems to well connected with local resources and involved with her children's schooling and needs as well.   CSW Plan/Description:  No Further Intervention Required/No Barriers to Discharge    Armstead Peaks, [place] 06/30/2015, 4:46 PM

## 2015-06-30 NOTE — Lactation Note (Addendum)
This note was copied from a baby's chart. Lactation Consultation Note  Offered to assist mother w/ latching and she stated she would like to pump instead. Set up DEBP.  Reviewed cleaning and milk storage. Suggest mother contact [place] [facility] if she wants to continue pumping. Once pump was set up mother was able to pump 30 ml. Mother plans to give volume to baby with next feeding with slow flow nipple bottle. Discussed spoon feeding for smaller amounts. She feels her breasts are starting to fill.  Encouraged her to pump every 3 hours for 10-15 min. if desired to help establish her milk supply Reviewed piston pump for use at home and provided her w/ harmony manual pump. Reviewed engorgement care. Discussed with her the possiblity of a 2 week pump rental.  Patient Name: Nicole Robinson [id number] Date: 06/30/2015 Reason for consult: Follow-up assessment   Maternal Data    Feeding Feeding Type: Bottle Fed - Formula Nipple Type: Slow - flow  LATCH Score/Interventions                      Lactation Tools Discussed/Used Pump Review: Setup, frequency, and cleaning;Milk Storage Initiated by:: Vivianne Master RN Date initiated:: 06/30/15   Consult Status Consult Status: Follow-up Date: 07/01/15 Follow-up type: In-patient    Vivianne Master [facility] 06/30/2015, 11:15 PM

## 2015-06-30 NOTE — Progress Notes (Signed)
I was called into pts room because she said she was having chest pain. The nurse tech said that when she got in the room the patient was twitching and couldn't catch her breath. I checked her vitals and her BP was 140/82 and pulse was 78. She no longer had any chest pain but was scared. She has a history of anxiety but stated she has never had a panic attack. She used to take Ativan but said she stopped taking it. I called Dr. Ouida Sills and he didn't give any new orders just said to watch her.

## 2015-07-01 MED ORDER — OXYCODONE HCL 5 MG PO TABS: 5.0000 mg | ORAL_TABLET | ORAL | Status: DC | PRN

## 2015-07-01 NOTE — Lactation Note (Signed)
This note was copied from a baby's chart. Lactation Consultation Note  Patient Name: Nicole Robinson [id number] Date: 07/01/2015 Reason for consult: Follow-up assessment  Baby 47 hours old. Mom reports that she has not been in contact with WIC since the baby was born. Mom states that she does not want to BF baby, and isn't sure if she wants to keep pumping either. Mom aware of 2-week rental and states that she will send FOB up to hospital to rent a pump if she changes her mind. Mom aware of weekday office hours. Mom aware of how to use piston in kit to pump as needed. Mom states that baby ate the 10 ml of EBM that she last pumped and then had 2 ounces of formula. Mom reports that she will offer baby less at next feeding and see if she is satisfied because baby spit up after the last feeding.   Mom aware of OP/BFSG and [place] phone line assistance after D/C. Maternal Data    Feeding Feeding Type: Bottle Fed - Formula Nipple Type: Slow - flow  LATCH Score/Interventions                      Lactation Tools Discussed/Used     Consult Status Consult Status: PRN    [name] 07/01/2015, 10:52 AM

## 2015-07-01 NOTE — Clinical Social Work Note (Signed)
[name] assessed by CSW yesterday. This CSW followed up with [name] as RN reports she had a panic attack overnight. [name] indicates she was having an intense dream and woke up with her heart racing and experiencing chest pain. She admits that this was scary. [name] has hx of anxiety, but feels this was just situational and was years ago. [name] believes experience last night was just hormonal and wants to see if it persists before she seeks any follow up. She will have FOB and her 33 year old daughter at home. She has been seen at [facility] in the past and feels comfortable making appointment there if needed. She states multiple times that she seeks help whenever necessary. [name] is eager to return home today.  Benay Pike, [place]

## 2015-07-01 NOTE — Progress Notes (Signed)
Pt had another episode of her heart racing and chest pain after waking up from a dream. Told her to talk to her doctor about possibly going home with something.

## 2015-07-01 NOTE — Discharge Summary (Signed)
Obstetric Discharge Summary Reason for Admission: cesarean section Prenatal Procedures: ultrasound Intrapartum Procedures: cesarean: low cervical, transverse Postpartum Procedures: none Complications-Operative and Postpartum: none HEMOGLOBIN  Date Value Ref Range Status  06/29/2015 9.0* 12.0 - 15.0 g/dL Final   HCT  Date Value Ref Range Status  06/29/2015 28.0* 36.0 - 46.0 % Final    Physical Exam:  General: alert and cooperative Lochia: appropriate Uterine Fundus: firm Incision: healing well, no significant drainage DVT Evaluation: No evidence of DVT seen on physical exam.  Discharge Diagnoses: Term Pregnancy-delivered  Discharge Information: Date: 07/01/2015 Activity: pelvic rest Diet: routine Medications: PNV, Ibuprofen and Percocet Condition: stable Instructions: refer to practice specific booklet Discharge to: home Follow-up Information    Follow up with HORVATH,MICHELLE A, MD In 4 weeks.   Specialty:  Obstetrics and Gynecology   Contact information:   [address] [place] [id number] [phone]       Newborn Data: Live born female  Birth Weight: 6 lb 13.4 oz (3100 g) APGAR: 63, 10  Home with mother.  Allyn Kenner 07/01/2015, 9:42 AM

## 2015-07-02 ENCOUNTER — Encounter (HOSPITAL_COMMUNITY): Payer: Self-pay | Admitting: *Deleted

## 2015-07-02 ENCOUNTER — Emergency Department (HOSPITAL_COMMUNITY)
Admission: EM | Admit: 2015-07-02 | Discharge: 2015-07-02 | Disposition: A | Discharge status: Home / Self Care | Payer: Medicaid Other | Source: Home / Self Care

## 2015-07-02 ENCOUNTER — Emergency Department (HOSPITAL_COMMUNITY)
Admission: EM | Admit: 2015-07-02 | Discharge: 2015-07-03 | Disposition: A | Discharge status: Home / Self Care | Payer: Medicaid Other | Attending: Emergency Medicine | Admitting: Emergency Medicine

## 2015-07-02 ENCOUNTER — Encounter (HOSPITAL_COMMUNITY): Payer: Self-pay | Admitting: Emergency Medicine

## 2015-07-02 DIAGNOSIS — M7989 Other specified soft tissue disorders: Secondary | ICD-10-CM | POA: Insufficient documentation

## 2015-07-02 DIAGNOSIS — G8918 Other acute postprocedural pain: Secondary | ICD-10-CM

## 2015-07-02 DIAGNOSIS — R011 Cardiac murmur, unspecified: Secondary | ICD-10-CM

## 2015-07-02 NOTE — ED Notes (Signed)
Pt had c section on Thursday. Having pain to her back when spinal was done. Reports pain and swelling intermittent to left foot. Also having lower abd pain that is unrelieved with pain meds given and also has breast pain and engorgement due to not breast feeding and her pump not working.

## 2015-07-02 NOTE — ED Notes (Signed)
Pt states that she had a C section 4 days ago and now she has increased swelling in her legs (MDs told her it was normal) but it is not going down and she is having more pain in her back, incision site and L leg. Alert and oriented.

## 2015-07-03 NOTE — ED Notes (Signed)
Called from triage, no answer

## 2015-07-06 ENCOUNTER — Emergency Department (HOSPITAL_COMMUNITY)
Admission: EM | Admit: 2015-07-06 | Discharge: 2015-07-06 | Disposition: A | Discharge status: Home / Self Care | Payer: Medicaid Other | Attending: Emergency Medicine | Admitting: Emergency Medicine

## 2015-07-06 ENCOUNTER — Encounter (HOSPITAL_COMMUNITY): Payer: Self-pay

## 2015-07-06 DIAGNOSIS — R600 Localized edema: Secondary | ICD-10-CM | POA: Diagnosis not present

## 2015-07-06 DIAGNOSIS — R1030 Lower abdominal pain, unspecified: Secondary | ICD-10-CM

## 2015-07-06 DIAGNOSIS — R011 Cardiac murmur, unspecified: Secondary | ICD-10-CM | POA: Diagnosis not present

## 2015-07-06 DIAGNOSIS — O900 Disruption of cesarean delivery wound: Secondary | ICD-10-CM | POA: Insufficient documentation

## 2015-07-06 DIAGNOSIS — M545 Low back pain, unspecified: Secondary | ICD-10-CM

## 2015-07-06 DIAGNOSIS — E669 Obesity, unspecified: Secondary | ICD-10-CM | POA: Insufficient documentation

## 2015-07-06 DIAGNOSIS — Z8619 Personal history of other infectious and parasitic diseases: Secondary | ICD-10-CM | POA: Insufficient documentation

## 2015-07-06 DIAGNOSIS — Z8659 Personal history of other mental and behavioral disorders: Secondary | ICD-10-CM | POA: Insufficient documentation

## 2015-07-06 DIAGNOSIS — Z87891 Personal history of nicotine dependence: Secondary | ICD-10-CM | POA: Insufficient documentation

## 2015-07-06 DIAGNOSIS — O9089 Other complications of the puerperium, not elsewhere classified: Secondary | ICD-10-CM | POA: Diagnosis not present

## 2015-07-06 DIAGNOSIS — O99215 Obesity complicating the puerperium: Secondary | ICD-10-CM | POA: Insufficient documentation

## 2015-07-06 DIAGNOSIS — R609 Edema, unspecified: Secondary | ICD-10-CM

## 2015-07-06 MED ORDER — CEPHALEXIN 500 MG PO CAPS: 1000.0000 mg | ORAL_CAPSULE | Freq: Two times a day (BID) | ORAL | Status: DC

## 2015-07-06 MED ORDER — ACETAMINOPHEN-CODEINE #3 300-30 MG PO TABS: 1.0000 | ORAL_TABLET | Freq: Four times a day (QID) | ORAL | Status: DC | PRN

## 2015-07-06 NOTE — ED Notes (Signed)
Pt here with c/o back pain, left leg edema and wound check.  Pt recently had c-section 8 days ago. Pt has lower back pain.  Odor to incision site. And left leg edema. Edema noted throughout pregnancy and has had no change.  Notified MD and told to come for follow up on June 2.  Pt has concerns at this time to be addressed

## 2015-07-06 NOTE — ED Provider Notes (Signed)
CSN: [id number]     Arrival date & time 07/06/15  1129 History   First MD Initiated Contact with Patient 07/06/15 1246     Chief Complaint  Patient presents with  . Back Pain  . Leg Swelling     (Consider location/radiation/quality/duration/timing/severity/associated sxs/prior Treatment) HPI   35 year old female with history of anxiety, depression, recent C-section 8 days ago presenting with complaints of low back pain and leg swelling. Patient mention persistent pain at the surgical incision site in her low abdomen with some foul odor and mild drainage which she is concerned for possible infection. Denies having associated fever or abdominal cramping. She also complaining of persistent low back pain since the surgery and described as a mild achy throbbing sensation nonradiating. Furthermore she has had leg swelling since pregnancy and continues to endorse leg swelling after her pregnancy. She did initially attempt to contact her OB/GYN but accidentally gave the wrong name and was told that the next available appointment is not into June. She decided to come to ER but while waiting the ED, she realized that her OB/GYN is a different doctor. She did contact the back and is currently waiting to have a follow-up appointment. In the meantime patient denies any specific treatment tried. She is not actively breast-feeding. Her baby is healthy. No complaints of chest pain, shortness of breath, dysuria, hematuria, or rash. She did feel that her left leg is more tender as compared to her right. She denies any prior history of PE or DVT.  Past Medical History  Diagnosis Date  . Anxiety   . Depression   . ADD (attention deficit disorder)   . Heart murmur   . Cyst of cervix   . Chlamydia   . Hx of varicella   . History of alcohol abuse   . Complication of anesthesia     "resistant to anesthesia"   Past Surgical History  Procedure Laterality Date  . Cesarean section    . Wisdom tooth extraction     . Cesarean section N/A 06/28/2015    Procedure: CESAREAN SECTION;  Surgeon: Bobbye Charleston, MD;  Location: [place];  Service: Obstetrics;  Laterality: N/A;  . Mole removal  06/28/2015    Procedure: MOLE REMOVAL;  Surgeon: Bobbye Charleston, MD;  Location: [place];  Service: Obstetrics;;  offof abdomen   Family History  Problem Relation Age of Onset  . Diabetes Maternal Aunt   . Diabetes Maternal Grandmother    Social History  Substance Use Topics  . Smoking status: Former Smoker -- 0.25 packs/day    Quit date: 11/21/2014  . Smokeless tobacco: None  . Alcohol Use: 3.3 oz/week    3 Glasses of wine, 3 Standard drinks or equivalent per week     Comment: occasional   OB History    Gravida Para Term Preterm AB TAB SAB Ectopic Multiple Living   6 3 2 1 3 2 1   0 3     Review of Systems  All other systems reviewed and are negative.     Allergies  Other  Home Medications   Prior to Admission medications   Medication Sig Start Date End Date Taking? Authorizing Provider  oxyCODONE (OXY IR/ROXICODONE) 5 MG immediate release tablet Take 1 tablet (5 mg total) by mouth every 4 (four) hours as needed (pain scale 4-7). Patient not taking: Reported on 07/06/2015 07/01/15   Allyn Kenner, DO   There were no vitals taken for this visit. Physical Exam  Constitutional:  She appears well-developed and well-nourished. No distress.  Obese African-American female nontoxic in appearance  HENT:  Head: Atraumatic.  Eyes: Conjunctivae are normal.  Neck: Neck supple.  Cardiovascular: Normal rate, regular rhythm and intact distal pulses.   Pulmonary/Chest: Effort normal and breath sounds normal.  Abdominal: Soft. Bowel sounds are normal. She exhibits no distension. There is tenderness (Horizontal surgical site to her lower panus from recent C-section with subcuticular sutures in place. There is several area of mild dehiscence and mild odor without any surrounding skin erythema.  Site is tender to palpation).  Musculoskeletal: She exhibits edema (Trace pitting edema to bilateral extremities without palpable cords, erythema or rash.) and tenderness (Diffuse tenderness to low back on palpation without focal point tenderness.  no CVA tenderness. -SLR).  Neurological: She is alert.  Skin: No rash noted.  Psychiatric: She has a normal mood and affect.  Nursing note and vitals reviewed.   ED Course  Procedures (including critical care time)   MDM   Final diagnoses:  Lower abdominal pain  Edema, peripheral  Bilateral low back pain without sciatica    BP 146/76 mmHg  Pulse 86  Temp(Src) 98.2 F (36.8 C) (Oral)  Resp 17  SpO2 99%   1:10 PM Patient here with complaints of pain and odor coming from her recent C-section surgical site. There is some mild dehiscence and odor without any discharge or erythema. However, given the pain and the potential for infection, patient will receive Keflex antibiotic. I do not think it is appropriate to remove her sutures at this time. She has diffuse low back pain that is very nonspecific and not directly overlying the lumbar midline region. This is likely due to recent surgery and pregnancy. She also has trace pitting edema to her lower extremities likely secondary to recent pregnancy. She did report that her left leg is more tender than her right. Although my suspicion is low for DVT, I did offer a Doppler ultrasound. Patient felt comfortable follow-up with her  OB/GYN for follow up and did not want the Doppler ultrasound at this time. She understands the risk of undiagnosed DVT to return if her condition worsen. Short course of pain medication will be provided. Doubt sciatica  Domenic Moras, PA-C 07/08/15 [place], MD 07/08/15 [phone]

## 2015-07-06 NOTE — Discharge Instructions (Signed)
Please follow up with your OBGYN for further management of your pain and removal of your abdominal sutures.  Take antibiotic as prescribed, take pain medication as needed.  Discuss with your doctor about your leg pain and swelling.  If worsen, please return to the ER for a vascular doppler ultrasound to rule out potential of a blood clot in your leg.

## 2015-08-03 ENCOUNTER — Emergency Department (HOSPITAL_COMMUNITY)
Admission: EM | Admit: 2015-08-03 | Discharge: 2015-08-03 | Disposition: A | Discharge status: Home / Self Care | Payer: Medicaid Other | Attending: Emergency Medicine | Admitting: Emergency Medicine

## 2015-08-03 ENCOUNTER — Encounter (HOSPITAL_COMMUNITY): Payer: Self-pay | Admitting: Emergency Medicine

## 2015-08-03 DIAGNOSIS — Z87891 Personal history of nicotine dependence: Secondary | ICD-10-CM | POA: Insufficient documentation

## 2015-08-03 DIAGNOSIS — K029 Dental caries, unspecified: Secondary | ICD-10-CM | POA: Insufficient documentation

## 2015-08-03 DIAGNOSIS — K047 Periapical abscess without sinus: Secondary | ICD-10-CM

## 2015-08-03 DIAGNOSIS — K0889 Other specified disorders of teeth and supporting structures: Secondary | ICD-10-CM | POA: Diagnosis present

## 2015-08-03 DIAGNOSIS — F329 Major depressive disorder, single episode, unspecified: Secondary | ICD-10-CM | POA: Diagnosis not present

## 2015-08-03 MED ORDER — PENICILLIN V POTASSIUM 500 MG PO TABS: 1000.0000 mg | ORAL_TABLET | Freq: Two times a day (BID) | ORAL | Status: DC

## 2015-08-03 MED ORDER — HYDROCODONE-ACETAMINOPHEN 5-325 MG PO TABS: 1.0000 | ORAL_TABLET | Freq: Four times a day (QID) | ORAL | Status: DC | PRN

## 2015-08-03 NOTE — ED Notes (Addendum)
Pt c/o pain from a tooth decaying and finally broke off about 3 am last night. Pt states "It feels like a nerve is exposed and it's causing me a lot of pain." Pt is currently taking antibiotics for bacterial vaginosis.

## 2015-08-03 NOTE — ED Provider Notes (Signed)
CSN: [id number]     Arrival date & time 08/03/15  1319 History  By signing my name below, I, [facility], attest that this documentation has been prepared under the direction and in the presence of Shakeisha Horine Camprubi-Soms, PA-C. Electronically Signed: Virgel Bouquet, ED Scribe. 08/03/2015. 2:19 PM.   Chief Complaint  Patient presents with  . Dental Pain    Patient is a 35 y.o. female presenting with tooth pain. The history is provided by the patient. No language interpreter was used.  Dental Pain Location:  Upper Upper teeth location:  2/RU 2nd molar Quality:  Sharp and burning Severity:  Severe Onset quality:  Gradual Duration:  11 hours Timing:  Constant Progression:  Unchanged Chronicity:  Recurrent Context: dental caries and dental fracture   Relieved by:  Ice Worsened by:  Pressure Ineffective treatments:  Acetaminophen Associated symptoms: headaches (from radiating tooth pain)   Associated symptoms: no difficulty swallowing, no drooling, no facial swelling, no fever, no gum swelling, no neck pain, no neck swelling, no oral bleeding, no oral lesions and no trismus   Risk factors: lack of dental care   Risk factors: no smoking    HPI Comments: Nicole Robinson is a 35 y.o. female who presents to the Emergency Department complaining of constant, sharp/searing, 10/10 upper right dental pain that radiates into her R head, worse with chewing and improved with cold food and drinks, onset last night. Pt states that she has a decayed tooth on her upper right throughout her pregnancy that was asymptomatic until a piece of it broke off 11 hours ago followed immediately by pain. She reports that a fragment of the tooth is still present in her mouth. She has taken Tylenol with relief of her head pain only. She is currently taking Flagyl for BV. Per pt, she notes that she is on pregnancy Medicaid which does not cover dental work after birth. Denies breastfeeding currently. Denies  drooling, drainage, gingival/face/neck swelling, ear pain, ear drainage, trismus, difficulty swallowing, fevers, chills, SOB, CP, nausea, vomiting, abdominal pain, diarrhea, constipation, dysuria, hematuria, myalgias, arthralgias, tingling, numbness, or weakness. NKDA.   Past Medical History  Diagnosis Date  . Anxiety   . Depression   . ADD (attention deficit disorder)   . Heart murmur   . Cyst of cervix   . Chlamydia   . Hx of varicella   . History of alcohol abuse   . Complication of anesthesia     "resistant to anesthesia"   Past Surgical History  Procedure Laterality Date  . Cesarean section    . Wisdom tooth extraction    . Cesarean section N/A 06/28/2015    Procedure: CESAREAN SECTION;  Surgeon: Bobbye Charleston, MD;  Location: [place];  Service: Obstetrics;  Laterality: N/A;  . Mole removal  06/28/2015    Procedure: MOLE REMOVAL;  Surgeon: Bobbye Charleston, MD;  Location: [place];  Service: Obstetrics;;  offof abdomen   Family History  Problem Relation Age of Onset  . Diabetes Maternal Aunt   . Diabetes Maternal Grandmother    Social History  Substance Use Topics  . Smoking status: Former Smoker -- 0.25 packs/day    Quit date: 11/21/2014  . Smokeless tobacco: None  . Alcohol Use: 3.3 oz/week    3 Glasses of wine, 3 Standard drinks or equivalent per week     Comment: occasional   OB History    Gravida Para Term Preterm AB TAB SAB Ectopic Multiple Living   6 3 2  1 3 2 1   0 3     Review of Systems  Constitutional: Negative for fever and chills.  HENT: Positive for dental problem. Negative for drooling, ear discharge, ear pain, facial swelling, mouth sores and trouble swallowing.   Respiratory: Negative for shortness of breath.   Cardiovascular: Negative for chest pain.  Gastrointestinal: Negative for nausea, vomiting and abdominal pain.  Genitourinary: Negative for dysuria and hematuria.  Musculoskeletal: Negative for myalgias, arthralgias and  neck pain.  Skin: Negative for color change.  Allergic/Immunologic: Negative for immunocompromised state.  Neurological: Positive for headaches (from radiating tooth pain). Negative for weakness and numbness.  Psychiatric/Behavioral: Negative for confusion.  10 Systems reviewed and are negative for acute change except as noted in the HPI.     Allergies  Other  Home Medications   Prior to Admission medications   Medication Sig Start Date End Date Taking? Authorizing Provider  acetaminophen-codeine (TYLENOL #3) 300-30 MG tablet Take 1-2 tablets by mouth every 6 (six) hours as needed for moderate pain. 07/06/15   Domenic Moras, PA-C  cephALEXin (KEFLEX) 500 MG capsule Take 2 capsules (1,000 mg total) by mouth 2 (two) times daily. 07/06/15   Domenic Moras, PA-C  HYDROcodone-acetaminophen (NORCO) 5-325 MG tablet Take 1 tablet by mouth every 6 (six) hours as needed for severe pain. 08/03/15   Ashli Selders Camprubi-Soms, PA-C  oxyCODONE (OXY IR/ROXICODONE) 5 MG immediate release tablet Take 1 tablet (5 mg total) by mouth every 4 (four) hours as needed (pain scale 4-7). Patient not taking: Reported on 07/06/2015 07/01/15   Allyn Kenner, DO  penicillin v potassium (VEETID) 500 MG tablet Take 2 tablets (1,000 mg total) by mouth 2 (two) times daily. X 7 days 08/03/15   Eithel Ryall Camprubi-Soms, PA-C   BP 107/52 mmHg  Pulse 80  Temp(Src) 98.4 F (36.9 C) (Oral)  Resp 16  SpO2 99% Physical Exam  Constitutional: She is oriented to person, place, and time. Vital signs are normal. She appears well-developed and well-nourished.  Non-toxic appearance. No distress.  Afebrile, nontoxic, NAD  HENT:  Head: Normocephalic and atraumatic.  Nose: Nose normal.  Mouth/Throat: Uvula is midline, oropharynx is clear and moist and mucous membranes are normal. No trismus in the jaw. Dental caries present. No dental abscesses or uvula swelling.    Right upper molar #2 decayed with a fragment of the tooth still intact but the  majority of the tooth is absent with a canal of exposed tissue beneath the tooth, gingiva without erythema or swelling, no dental abscess or evidence of Ludwig's. Nose clear. Oropharynx clear and moist, without uvular swelling or deviation, no trismus or drooling, no tonsillar swelling or erythema, no exudates.  Eyes: Conjunctivae and EOM are normal. Right eye exhibits no discharge. Left eye exhibits no discharge.  Neck: Normal range of motion. Neck supple.  Cardiovascular: Normal rate.   Pulmonary/Chest: Effort normal. No respiratory distress.  Abdominal: Normal appearance. She exhibits no distension.  Musculoskeletal: Normal range of motion.  Neurological: She is alert and oriented to person, place, and time. She has normal strength. No sensory deficit.  Skin: Skin is warm, dry and intact. No rash noted.  Psychiatric: She has a normal mood and affect. Her behavior is normal.  Nursing note and vitals reviewed.   ED Course  Procedures   DIAGNOSTIC STUDIES: Oxygen Saturation is 99% on RA, normal by my interpretation.    COORDINATION OF CARE: 2:08 PM Will provide pt with referral list of dentists. Advised pt to follow-up with  a dentist. Advised pt to continue at home symptomatic treatment with Tylenol. Will prescribe antibiotics. Discussed treatment plan with pt at bedside and pt agreed to plan.   MDM   Final diagnoses:  Pain due to dental caries  Dental decay  Dental infection    35 y.o. female here with Dental pain associated with dental decay and possible dental infection with patient afebrile, non toxic appearing and swallowing secretions well. Not currently breastfeeding. I gave patient referral to dentist and stressed the importance of dental follow up for ultimate management of dental pain.  I have also discussed reasons to return immediately to the ER.  Patient expresses understanding and agrees with plan.  I will also give PCN VK and pain control.   I personally performed the  services described in this documentation, which was scribed in my presence. The recorded information has been reviewed and is accurate.  BP 107/52 mmHg  Pulse 80  Temp(Src) 98.4 F (36.9 C) (Oral)  Resp 16  SpO2 99%  Meds ordered this encounter  Medications  . penicillin v potassium (VEETID) 500 MG tablet    Sig: Take 2 tablets (1,000 mg total) by mouth 2 (two) times daily. X 7 days    Dispense:  28 tablet    Refill:  0    Order Specific Question:  Supervising Provider    Answer:  MILLER, BRIAN [[id number]]  . HYDROcodone-acetaminophen (NORCO) 5-325 MG tablet    Sig: Take 1 tablet by mouth every 6 (six) hours as needed for severe pain.    Dispense:  6 tablet    Refill:  0    Order Specific Question:  Supervising Provider    Answer:  Noemi Chapel [[id number]]      Al Bracewell Camprubi-Soms, PA-C 08/03/15 1423  Milton Ferguson, MD 08/03/15 2145

## 2015-08-03 NOTE — Discharge Instructions (Signed)
Apply warm compresses to jaw throughout the day. Take antibiotic until finished. Use tylenol or motrin as needed for pain, using norco as directed as needed for severe pain but don't drive or operate machinery while taking this medication. Perform salt water swishes to help with pain/swelling. Followup with a dentist is very important for ongoing evaluation and management of recurrent dental pain, call the dentist listed above in the next 24-48 hours to schedule ongoing dental care, or use the list below to find a dentist. Return to emergency department for emergent changing or worsening symptoms.   Dental Caries Dental caries is tooth decay. This decay can cause a hole in teeth (cavity) that can get bigger and deeper over time. HOME CARE  Brush and floss your teeth. Do this at least two times a day.  Use a fluoride toothpaste.  Use a mouth rinse if told by your dentist or doctor.  Eat less sugary and starchy foods. Drink less sugary drinks.  Avoid snacking often on sugary and starchy foods. Avoid sipping often on sugary drinks.  Keep regular checkups and cleanings with your dentist.  Use fluoride supplements if told by your dentist or doctor.  Allow fluoride to be applied to teeth if told by your dentist or doctor.   This information is not intended to replace advice given to you by your health care provider. Make sure you discuss any questions you have with your health care provider.   Document Released: 11/20/2007 Document Revised: 03/03/2014 Document Reviewed: 02/13/2012 Elsevier Interactive Patient Education 2016 [place] Pain Dental pain may be caused by many things, including:  Tooth decay (cavities or caries). Cavities cause the nerve of your tooth to be open to air and hot or cold temperatures. This can cause pain or discomfort.  Abscess or infection. A dental abscess is an area that is full of infected pus from a bacterial infection in the inner part of the  tooth (pulp). It usually happens at the end of the tooth's root.  Injury.  An unknown reason (idiopathic). Your pain may be mild or severe. It may only happen when:  You are chewing.  You are exposed to hot or cold temperature.  You are eating or drinking sugary foods or beverages, such as:  Soda.  Candy. Your pain may also be there all of the time. HOME CARE Watch your dental pain for any changes. Do these things to lessen your discomfort:  Take medicines only as told by your dentist.  If your dentist tells you to take an antibiotic medicine, finish all of it even if you start to feel better.  Keep all follow-up visits as told by your dentist. This is important.  Do not apply heat to the outside of your face.  Rinse your mouth or gargle with salt water if told by your dentist. This helps with pain and swelling.  You can make salt water by adding  tsp of salt to 1 cup of warm water.  Apply ice to the painful area of your face:  Put ice in a plastic bag.  Place a towel between your skin and the bag.  Leave the ice on for 20 minutes, 2-3 times per day.  Avoid foods or drinks that cause you pain, such as:  Very hot or very cold foods or drinks.  Sweet or sugary foods or drinks. GET HELP IF:  Your pain is not helped with medicines.  Your symptoms are worse.  You have new symptoms.  GET HELP RIGHT AWAY IF:  You cannot open your mouth.  You are having trouble breathing or swallowing.  You have a fever.  Your face, neck, or jaw is puffy (swollen).   This information is not intended to replace advice given to you by your health care provider. Make sure you discuss any questions you have with your health care provider.   Document Released: 07/30/2007 Document Revised: 06/27/2014 Document Reviewed: 02/06/2014 Elsevier Interactive Patient Education 2016 [employer].   Emergency Department Resource Guide 1) Find a Doctor and Pay Out of Pocket Although you  won't have to find out who is covered by your insurance plan, it is a good idea to ask around and get recommendations. You will then need to call the office and see if the doctor you have chosen will accept you as a new patient and what types of options they offer for patients who are self-pay. Some doctors offer discounts or will set up payment plans for their patients who do not have insurance, but you will need to ask so you aren't surprised when you get to your appointment.  2) Contact Your Local Health Department Not all health departments have doctors that can see patients for sick visits, but many do, so it is worth a call to see if yours does. If you don't know where your local health department is, you can check in your phone book. The CDC also has a tool to help you locate your state's health department, and many state websites also have listings of all of their local health departments.  3) Find a [facility] If your illness is not likely to be very severe or complicated, you may want to try a walk in clinic. These are popping up all over the country in pharmacies, drugstores, and shopping centers. They're usually staffed by nurse practitioners or physician assistants that have been trained to treat common illnesses and complaints. They're usually fairly quick and inexpensive. However, if you have serious medical issues or chronic medical problems, these are probably not your best option.  No Primary Care Doctor: - Call Health Connect at  [phone] - they can help you locate a primary care doctor that  accepts your insurance, provides certain services, etc. - Physician Referral Service- [phone]  Chronic Pain Problems: Organization         Address  Phone   Notes  [facility]  [phone] Patients need to be referred by their primary care doctor.   Medication Assistance: Organization         Address  Phone   Notes  [facility] Medication [facility] [address] [place], [place] [address] [phone] --Must be a resident of [facility] -- Must have NO insurance coverage whatsoever (no Medicaid/ Medicare, etc.) -- The pt. MUST have a primary care doctor that directs their care regularly and follows them in the community   [facility]  [phone]   [place]  [phone]     Dental Care: Organization         Address  Phone  Notes  [facility] Department of [facility] [phone] Accepts children up to age 52 who are enrolled in [place] or [place]; pregnant women with a Medicaid card; and children who have applied for Medicaid or [place] Health Choice, but were declined, whose parents can pay a reduced fee at time of service.  [facility] Department of [facility]  [address], [place] [phone] Accepts children up to age 65 who are enrolled in [place] or [place]; pregnant women with a Medicaid card; and children who have applied for Medicaid or  Health Choice, but were declined, whose parents can pay a reduced fee at time of service.  [facility]  [place] [phone] Patients are seen by appointment only. Walk-ins are not accepted. [place] will see patients 97 years of age and older. Monday - Tuesday (8am-5pm) Most Wednesdays (8:30-5pm) $30 per visit, cash only  [facility] Adult [facility]  [address], [facility] [phone] Patients are seen by appointment only. Walk-ins are not accepted. [place] will see patients 55 years of age and older. One Wednesday Evening (Monthly: Volunteer Based).  $30 per visit, cash only  [place]  [phone] for adults; Children under age 49, call Graduate Pediatric Dentistry at [phone]. Children aged 54-14, please call [phone] to request  a pediatric application.  Dental services are provided in all areas of dental care including fillings, crowns and bridges, complete and partial dentures, implants, gum treatment, root canals, and extractions. Preventive care is also provided. Treatment is provided to both adults and children. Patients are selected via a lottery and there is often a waiting list.   [facility] [address], [place]  [phone] [email or web address]   [facility] [address] [place], [place] [phone], Ext. [phone] Second and Fourth Thursday of each month, opens at 6:30 AM; Clinic ends at 9 AM.  Patients are seen on a first-come first-served basis, and a limited number are seen during each clinic.   [facility]  [address] [place], [place] [phone]   Eligibility Requirements You must have lived in [place], [place], or [place] counties for at least the last three months.   You cannot be eligible for state or federal sponsored [employer], including [employer], [place], or [employer].   You generally cannot be eligible for healthcare insurance through your employer.    How to apply: Eligibility screenings are held every Tuesday and Wednesday afternoon from 1:00 pm until 4:00 pm. You do not need an appointment for the interview!  [facility] [address], [place], [place]   [place]  [place]  [place]  [phone]

## 2015-11-25 ENCOUNTER — Encounter (HOSPITAL_COMMUNITY): Payer: Self-pay | Admitting: *Deleted

## 2015-11-25 ENCOUNTER — Emergency Department (HOSPITAL_COMMUNITY)
Admission: EM | Admit: 2015-11-25 | Discharge: 2015-11-25 | Disposition: A | Discharge status: Home / Self Care | Payer: Medicaid Other | Attending: Emergency Medicine | Admitting: Emergency Medicine

## 2015-11-25 DIAGNOSIS — K029 Dental caries, unspecified: Secondary | ICD-10-CM | POA: Insufficient documentation

## 2015-11-25 DIAGNOSIS — K0889 Other specified disorders of teeth and supporting structures: Secondary | ICD-10-CM | POA: Diagnosis present

## 2015-11-25 DIAGNOSIS — Z87891 Personal history of nicotine dependence: Secondary | ICD-10-CM | POA: Diagnosis not present

## 2015-11-25 MED ORDER — NAPROXEN 500 MG PO TABS: 500.0000 mg | ORAL_TABLET | Freq: Two times a day (BID) | ORAL | 0 refills | Status: DC

## 2015-11-25 MED ORDER — BUPIVACAINE-EPINEPHRINE (PF) 0.5% -1:200000 IJ SOLN
1.8000 mL | Freq: Once | INTRAMUSCULAR | Status: AC
  Administered 2015-11-25: 1.8 mL
  Filled 2015-11-25: qty 1.8

## 2015-11-25 MED ORDER — PENICILLIN V POTASSIUM 500 MG PO TABS: 500.0000 mg | ORAL_TABLET | Freq: Three times a day (TID) | ORAL | 0 refills | Status: AC

## 2015-11-25 NOTE — Discharge Instructions (Signed)
Take antibiotics as prescribed. Look for over the counter temporary dental cement when you go to the pharmacy. I have also given you a list of dental clinics in the area. You need to see a dentist as soon as possible.

## 2015-11-25 NOTE — ED Triage Notes (Signed)
Pt stated her back tooth broke off and the air even bothers it. Pt has not gotten relief with oragel, motrin or tylenol. Pain is a 12/10.

## 2015-11-25 NOTE — ED Provider Notes (Signed)
[facility] DEPT Provider Note   CSN: [id number] Arrival date & time: 11/25/15  1701  By signing my name below, I, Soijett Blue, attest that this documentation has been prepared under the direction and in the presence of Malanie Koloski Y. Robbin Loughmiller, PA-C Electronically Signed: Soijett Blue, ED Scribe. 11/25/15. 5:48 PM.   History   Chief Complaint Chief Complaint  Patient presents with  . Dental Pain    HPI Nicole Robinson is a 35 y.o. female who presents to the Emergency Department complaining of 10/10, constant, throbbing, right, upper, back dental pain onset 2 days ago worsening today. Pt notes that pieces of her right upper back tooth have slowly been breaking off prior to her current symptoms. Pt right upper back dental pain is worsened with temperature changes and she denies any alleviating factors. Pt denies having a dentist at this time. She states that she has tried tylenol, motrin, and oragel, with no relief for her symptoms. She denies drainage, fever, chills, trouble swallowing, and any other symptoms.   The history is provided by the patient. No language interpreter was used.    Past Medical History:  Diagnosis Date  . ADD (attention deficit disorder)   . Anxiety   . Chlamydia   . Complication of anesthesia    "resistant to anesthesia"  . Cyst of cervix   . Depression   . Heart murmur   . History of alcohol abuse   . Hx of varicella     Patient Active Problem List   Diagnosis Date Noted  . Cesarean delivery delivered 07/01/2015    Past Surgical History:  Procedure Laterality Date  . CESAREAN SECTION    . CESAREAN SECTION N/A 06/28/2015   Procedure: CESAREAN SECTION;  Surgeon: Bobbye Charleston, MD;  Location: [facility];  Service: Obstetrics;  Laterality: N/A;  . MOLE REMOVAL  06/28/2015   Procedure: MOLE REMOVAL;  Surgeon: Bobbye Charleston, MD;  Location: [facility];  Service: Obstetrics;;  offof abdomen  . WISDOM TOOTH EXTRACTION      OB History    Gravida Para Term Preterm AB Living   6 3 2 1 3 3    SAB TAB Ectopic Multiple Live Births   1 2   0 3       Home Medications    Prior to Admission medications   Medication Sig Start Date End Date Taking? Authorizing Provider  acetaminophen-codeine (TYLENOL #3) 300-30 MG tablet Take 1-2 tablets by mouth every 6 (six) hours as needed for moderate pain. 07/06/15   Domenic Moras, PA-C  cephALEXin (KEFLEX) 500 MG capsule Take 2 capsules (1,000 mg total) by mouth 2 (two) times daily. 07/06/15   Domenic Moras, PA-C  HYDROcodone-acetaminophen (NORCO) 5-325 MG tablet Take 1 tablet by mouth every 6 (six) hours as needed for severe pain. 08/03/15   Mercedes Camprubi-Soms, PA-C  oxyCODONE (OXY IR/ROXICODONE) 5 MG immediate release tablet Take 1 tablet (5 mg total) by mouth every 4 (four) hours as needed (pain scale 4-7). Patient not taking: Reported on 11/25/2015 07/01/15   Allyn Kenner, DO  penicillin v potassium (VEETID) 500 MG tablet Take 2 tablets (1,000 mg total) by mouth 2 (two) times daily. X 7 days 08/03/15   Mercedes Camprubi-Soms, PA-C    Family History Family History  Problem Relation Age of Onset  . Diabetes Maternal Aunt   . Diabetes Maternal Grandmother     Social History Social History  Substance Use Topics  . Smoking status: Former Smoker    Packs/day:  0.25    Quit date: 11/21/2014  . Smokeless tobacco: Never Used  . Alcohol use 3.3 oz/week    3 Glasses of wine, 3 Standard drinks or equivalent per week     Comment: occasional     Allergies   Other   Review of Systems Review of Systems  Constitutional: Negative for chills and fever.  HENT: Positive for dental problem (right upper back). Negative for trouble swallowing.   All other systems reviewed and are negative.    Physical Exam Updated Vital Signs BP 128/82 (BP Location: Right Arm)   Pulse 86   Temp 98.7 F (37.1 C) (Oral)   Ht 5\' 3"  (1.6 m)   Wt 204 lb (92.5 kg)   LMP 11/25/2015   SpO2 100%   Breastfeeding? No    BMI 36.14 kg/m   Physical Exam  Constitutional: She is oriented to person, place, and time. She appears well-developed and well-nourished. No distress.  HENT:  Head: Normocephalic and atraumatic.  Mouth/Throat: Uvula is midline, oropharynx is clear and moist and mucous membranes are normal. No trismus in the jaw. Abnormal dentition. No dental abscesses.  Tooth number 3 broken at the level of the gum. Exposed portion of root is grossly decaying. Mild surrounding gingival bogginess without gross abscess. Uvula midline. No trismus.   Eyes: EOM are normal.  Neck: Neck supple.  Cardiovascular: Normal rate.   Pulmonary/Chest: Effort normal. No respiratory distress.  Abdominal: She exhibits no distension.  Musculoskeletal: Normal range of motion.  Neurological: She is alert and oriented to person, place, and time.  Skin: Skin is warm and dry.  Psychiatric: She has a normal mood and affect. Her behavior is normal.  Nursing note and vitals reviewed.    ED Treatments / Results  DIAGNOSTIC STUDIES: Oxygen Saturation is 100% on RA, nl by my interpretation.    COORDINATION OF CARE: 5:28 PM Discussed treatment plan with pt at bedside which includes dental block, penicillin Rx, naprosyn Rx, referral and follow up with dentist, and pt agreed to plan.   Procedures Dental Block Date/Time: 11/25/2015 5:40 PM Performed by: Anne Ng Authorized by: Anne Ng   Consent:    Consent obtained:  Verbal   Consent given by:  Patient   Risks discussed:  Unsuccessful block   Alternatives discussed:  Alternative treatment Indications:    Indications: dental pain   Location:    Block type:  Supraperiosteal Procedure details (see MAR for exact dosages):    Syringe type:  Aspirating dental syringe   Needle gauge:  27 G   Anesthetic injected:  Bupivacaine 0.5% WITH epi (1.5 ml)   Injection procedure:  Anatomic landmarks identified and anatomic landmarks palpated Post-procedure details:     Outcome:  Pain relieved (anesthesia achieved)   Patient tolerance of procedure:  Tolerated well, no immediate complications     Medications Ordered in ED Medications - No data to display   Initial Impression / Assessment and Plan / ED Course  I have reviewed the triage vital signs and the nursing notes.   Clinical Course     Patient with dentalgia.  No abscess requiring immediate incision and drainage.  Exam not concerning for Ludwig's angina or pharyngeal abscess. Pain resolved with dental block. Pt will be discharged home with penicillin Rx and naprosyn Rx. Pt informed about use of temporary dental cement for dental pain. Pt instructed to follow-up with dentist.  Discussed return precautions. Pt safe for discharge.   Final Clinical Impressions(s) /  ED Diagnoses   Final diagnoses:  Pain due to dental caries    New Prescriptions Discharge Medication List as of 11/25/2015  5:47 PM    START taking these medications   Details  naproxen (NAPROSYN) 500 MG tablet Take 1 tablet (500 mg total) by mouth 2 (two) times daily., Starting Sun 11/25/2015, Print    !! penicillin v potassium (VEETID) 500 MG tablet Take 1 tablet (500 mg total) by mouth 3 (three) times daily., Starting Sun 11/25/2015, Until Sun 12/02/2015, Print     !! - Potential duplicate medications found. Please discuss with provider.      I personally performed the services described in this documentation, which was scribed in my presence. The recorded information has been reviewed and is accurate.    Anne Ng, PA-C 11/25/15 1752    Fredia Sorrow, MD 11/28/15 [phone]

## 2015-12-10 ENCOUNTER — Encounter: Payer: Self-pay | Admitting: Physical Therapy

## 2015-12-10 ENCOUNTER — Ambulatory Visit: Payer: Medicaid Other | Attending: Internal Medicine | Admitting: Physical Therapy

## 2015-12-10 DIAGNOSIS — G8929 Other chronic pain: Secondary | ICD-10-CM

## 2015-12-10 DIAGNOSIS — M545 Low back pain: Secondary | ICD-10-CM | POA: Diagnosis not present

## 2015-12-10 NOTE — Therapy (Signed)
[place], [place], [address] Phone: [phone]   Fax:  [phone]  Physical Therapy Evaluation  Patient Details  Name: Nicole Robinson MRN: [id number] Date of Birth: 04/08/1980 Referring Provider: Nolene Ebbs, MD  Encounter Date: 12/10/2015      PT End of Session - 12/10/15 1522    Visit Number 1   Authorization Type medicaid- 1 time evaluation   PT Start Time 1522  pt arrived late   PT Stop Time 1552   PT Time Calculation (min) 30 min   Activity Tolerance Patient limited by pain   Behavior During Therapy Restless;Agitated      Past Medical History:  Diagnosis Date  . ADD (attention deficit disorder)   . Anxiety   . Chlamydia   . Complication of anesthesia    "resistant to anesthesia"  . Cyst of cervix   . Depression   . Heart murmur   . History of alcohol abuse   . Hx of varicella     Past Surgical History:  Procedure Laterality Date  . CESAREAN SECTION    . CESAREAN SECTION N/A 06/28/2015   Procedure: CESAREAN SECTION;  Surgeon: Bobbye Charleston, MD;  Location: [facility];  Service: Obstetrics;  Laterality: N/A;  . MOLE REMOVAL  06/28/2015   Procedure: MOLE REMOVAL;  Surgeon: Bobbye Charleston, MD;  Location: [facility];  Service: Obstetrics;;  offof abdomen  . WISDOM TOOTH EXTRACTION      There were no vitals filed for this visit.       Subjective Assessment - 12/10/15 1526    Subjective Fell and cracked sacrum in 2012. Has had pain lifting andlaying down. Increased when pregnant. Stiffness in bed, LBP. Tightening, pinching sensation   Currently in Pain? Yes   Pain Score 8    Pain Location Back   Pain Orientation Lower   Pain Descriptors / Indicators Tightness            [facility] PT Assessment - 12/10/15 0001      Assessment   Medical Diagnosis LBP   Referring Provider Nolene Ebbs, MD   Prior Therapy no     Precautions   Precautions None      Restrictions   Weight Bearing Restrictions No     Balance Screen   Has the patient fallen in the past 6 months No     Prior Function   Level of Independence Independent     Cognition   Overall Cognitive Status Within Functional Limits for tasks assessed     ROM / Strength   AROM / PROM / Strength --  unable to accurately assess due to pain                   [facility] Adult PT Treatment/Exercise - 12/10/15 0001      Exercises   Exercises Lumbar     Lumbar Exercises: Prone   Other Prone Lumbar Exercises UE lift, forehead on arms   Other Prone Lumbar Exercises LE lift bilateral     Lumbar Exercises: Quadruped   Other Quadruped Lumbar Exercises lumbar extension to neutral spine                            Plan - 12/10/15 1553    Clinical Impression Statement Pt presented to PT with complaints of severe LBP. Pt was unable to tolerate laying in hooklying today, after a couple of minutes reported  increased pain extending into buttock. When turned prone on elbows pt verbalized decrease in pain. Pt was unable to tolerate exercises attempted today without substantial increase in pain and reported dizziness when returning to seated position. I do not feel that this patient is appropriate for PT at this time and believe that this patient would benefit from further imaging of lumbar spine for diagnostic accuracy.    PT Treatment/Interventions ADLs/Self Care Home Management;Therapeutic exercise;Patient/family education   Consulted and Agree with Plan of Care Patient      Patient will benefit from skilled therapeutic intervention in order to improve the following deficits and impairments:  Pain  Visit Diagnosis: Chronic bilateral low back pain, with sciatica presence unspecified - Plan: PT plan of care cert/re-cert     Problem List Patient Active Problem List   Diagnosis Date Noted  . Cesarean delivery delivered 07/01/2015   Jacquel Redditt C. Lisaann Atha PT,  DPT 12/10/15 4:00 PM   [facility] [address] [place], [place], [address] Phone: [phone]   Fax:  [phone]  Name: Nicole Robinson MRN: [id number] Date of Birth: 02/25/1980

## 2016-05-01 ENCOUNTER — Encounter (HOSPITAL_COMMUNITY): Payer: Self-pay | Admitting: Emergency Medicine

## 2016-05-01 ENCOUNTER — Emergency Department (HOSPITAL_COMMUNITY)
Admission: EM | Admit: 2016-05-01 | Discharge: 2016-05-01 | Disposition: A | Discharge status: Home / Self Care | Payer: Medicaid Other | Attending: Emergency Medicine | Admitting: Emergency Medicine

## 2016-05-01 DIAGNOSIS — Z79899 Other long term (current) drug therapy: Secondary | ICD-10-CM | POA: Diagnosis not present

## 2016-05-01 DIAGNOSIS — Z87891 Personal history of nicotine dependence: Secondary | ICD-10-CM | POA: Diagnosis not present

## 2016-05-01 DIAGNOSIS — J029 Acute pharyngitis, unspecified: Secondary | ICD-10-CM | POA: Diagnosis present

## 2016-05-01 LAB — RAPID STREP SCREEN (MED CTR MEBANE ONLY): STREPTOCOCCUS, GROUP A SCREEN (DIRECT): NEGATIVE

## 2016-05-01 MED ORDER — CHLORHEXIDINE GLUCONATE 0.12 % MT SOLN: 15.0000 mL | Freq: Two times a day (BID) | OROMUCOSAL | 0 refills | Status: DC

## 2016-05-01 MED ORDER — BENZONATATE 100 MG PO CAPS: 100.0000 mg | ORAL_CAPSULE | Freq: Three times a day (TID) | ORAL | 0 refills | Status: DC

## 2016-05-01 NOTE — ED Triage Notes (Signed)
Pt to ED from home c/o sore throat, dry cough, congestion, and chills x 2 days. Denies fevers. States her daughter had it first and she feels like she has gotten whatever she had. Resp e/u.

## 2016-05-01 NOTE — ED Provider Notes (Signed)
[facility] DEPT Provider Note   CSN: [id number] Arrival date & time: 05/01/16  0146     History   Chief Complaint Chief Complaint  Patient presents with  . Cough  . Sore Throat    HPI Nicole Robinson is a 36 y.o. female.  HPI   36 year old female with history of recurrent strep infection presenting complaining of cold symptoms. Patient states her 61 month old daughter has had cold symptoms for the past 4 days. For the past 2 days patient develop sore throat, dry cough, congestion, and chills. She denies any associated fever. She denies nausea vomiting or diarrhea or rash. She was concern for potential strep infection.  Past Medical History:  Diagnosis Date  . ADD (attention deficit disorder)   . Anxiety   . Chlamydia   . Complication of anesthesia    "resistant to anesthesia"  . Cyst of cervix   . Depression   . Heart murmur   . History of alcohol abuse   . Hx of varicella     Patient Active Problem List   Diagnosis Date Noted  . Cesarean delivery delivered 07/01/2015    Past Surgical History:  Procedure Laterality Date  . CESAREAN SECTION    . CESAREAN SECTION N/A 06/28/2015   Procedure: CESAREAN SECTION;  Surgeon: Bobbye Charleston, MD;  Location: [facility];  Service: Obstetrics;  Laterality: N/A;  . MOLE REMOVAL  06/28/2015   Procedure: MOLE REMOVAL;  Surgeon: Bobbye Charleston, MD;  Location: [facility];  Service: Obstetrics;;  offof abdomen  . WISDOM TOOTH EXTRACTION      OB History    Gravida Para Term Preterm AB Living   6 3 2 1 3 3    SAB TAB Ectopic Multiple Live Births   1 2   0 3       Home Medications    Prior to Admission medications   Medication Sig Start Date End Date Taking? Authorizing Provider  acetaminophen-codeine (TYLENOL #3) 300-30 MG tablet Take 1-2 tablets by mouth every 6 (six) hours as needed for moderate pain. Patient not taking: Reported on 12/10/2015 07/06/15   Domenic Moras, PA-C  cephALEXin (KEFLEX) 500 MG  capsule Take 2 capsules (1,000 mg total) by mouth 2 (two) times daily. Patient not taking: Reported on 12/10/2015 07/06/15   Domenic Moras, PA-C  HYDROcodone-acetaminophen (NORCO) 5-325 MG tablet Take 1 tablet by mouth every 6 (six) hours as needed for severe pain. Patient not taking: Reported on 12/10/2015 08/03/15   Mercedes Street, PA-C  naproxen (NAPROSYN) 500 MG tablet Take 1 tablet (500 mg total) by mouth 2 (two) times daily. 11/25/15   Olivia Canter Sam, PA-C  oxyCODONE (OXY IR/ROXICODONE) 5 MG immediate release tablet Take 1 tablet (5 mg total) by mouth every 4 (four) hours as needed (pain scale 4-7). Patient not taking: Reported on 12/10/2015 07/01/15   Allyn Kenner, DO  penicillin v potassium (VEETID) 500 MG tablet Take 2 tablets (1,000 mg total) by mouth 2 (two) times daily. X 7 days 08/03/15   Reece Agar, PA-C    Family History Family History  Problem Relation Age of Onset  . Diabetes Maternal Aunt   . Diabetes Maternal Grandmother     Social History Social History  Substance Use Topics  . Smoking status: Former Smoker    Packs/day: 0.25    Quit date: 11/21/2014  . Smokeless tobacco: Never Used  . Alcohol use 3.3 oz/week    3 Glasses of wine, 3 Standard drinks or equivalent  per week     Comment: occasional     Allergies   Other   Review of Systems Review of Systems  All other systems reviewed and are negative.    Physical Exam Updated Vital Signs BP 125/80 (BP Location: Left Arm)   Pulse 92   Temp 98.2 F (36.8 C) (Oral)   Resp 20   LMP 04/22/2016 (Exact Date)   SpO2 100%   Physical Exam  Constitutional: She appears well-developed and well-nourished. No distress.  HENT:  Head: Atraumatic.  Right Ear: External ear normal.  Left Ear: External ear normal.  Nose: Nose normal.  Mouth/Throat: Oropharynx is clear and moist.  Eyes: Conjunctivae are normal.  Neck: Normal range of motion. Neck supple.  Cardiovascular: Normal rate and regular rhythm.     Pulmonary/Chest: Effort normal and breath sounds normal.  Abdominal: Soft. There is no tenderness.  Lymphadenopathy:    She has no cervical adenopathy.  Neurological: She is alert.  Skin: No rash noted.  Psychiatric: She has a normal mood and affect.  Nursing note and vitals reviewed.    ED Treatments / Results  Labs (all labs ordered are listed, but only abnormal results are displayed) Labs Reviewed  RAPID STREP SCREEN (NOT AT [facility])  CULTURE, GROUP A STREP [facility])    EKG  EKG Interpretation None       Radiology No results found.  Procedures Procedures (including critical care time)  Medications Ordered in ED Medications - No data to display   Initial Impression / Assessment and Plan / ED Course  I have reviewed the triage vital signs and the nursing notes.  Pertinent labs & imaging results that were available during my care of the patient were reviewed by me and considered in my medical decision making (see chart for details).     BP 125/80 (BP Location: Left Arm)   Pulse 92   Temp 98.2 F (36.8 C) (Oral)   Resp 20   LMP 04/22/2016 (Exact Date)   SpO2 100%    Final Clinical Impressions(s) / ED Diagnoses   Final diagnoses:  Viral pharyngitis    New Prescriptions New Prescriptions   No medications on file   Pt with negative strep. Diagnosis of viral pharyngitis. No abx indicated at this time. Discussed that results of strep culture are pending and patient will be informed if positive result and abx will be called in at that time. Discharge with symptomatic tx. No evidence of dehydration. Pt is tolerating secretions. Presentation not concerning for peritonsillar abscess or spread of infection to deep spaces of the throat; patent airway. Specific return precautions discussed. Recommended PCP follow up. Pt appears safe for discharge.    Domenic Moras, PA-C 05/01/16 [place], DO 05/01/16 [phone]

## 2016-05-03 LAB — CULTURE, GROUP A STREP (THRC)

## 2016-05-05 ENCOUNTER — Emergency Department (HOSPITAL_BASED_OUTPATIENT_CLINIC_OR_DEPARTMENT_OTHER)
Admission: EM | Admit: 2016-05-05 | Discharge: 2016-05-05 | Disposition: A | Discharge status: Home / Self Care | Payer: Medicaid Other | Attending: Dermatology | Admitting: Dermatology

## 2016-05-05 ENCOUNTER — Encounter (HOSPITAL_BASED_OUTPATIENT_CLINIC_OR_DEPARTMENT_OTHER): Payer: Self-pay | Admitting: *Deleted

## 2016-05-05 DIAGNOSIS — Z87891 Personal history of nicotine dependence: Secondary | ICD-10-CM | POA: Diagnosis not present

## 2016-05-05 DIAGNOSIS — Z791 Long term (current) use of non-steroidal anti-inflammatories (NSAID): Secondary | ICD-10-CM | POA: Diagnosis not present

## 2016-05-05 DIAGNOSIS — Z5321 Procedure and treatment not carried out due to patient leaving prior to being seen by health care provider: Secondary | ICD-10-CM | POA: Insufficient documentation

## 2016-05-05 DIAGNOSIS — N898 Other specified noninflammatory disorders of vagina: Secondary | ICD-10-CM | POA: Insufficient documentation

## 2016-05-05 LAB — URINALYSIS, ROUTINE W REFLEX MICROSCOPIC
BILIRUBIN URINE: NEGATIVE
Glucose, UA: NEGATIVE mg/dL
Hgb urine dipstick: NEGATIVE
KETONES UR: NEGATIVE mg/dL
NITRITE: NEGATIVE
Protein, ur: NEGATIVE mg/dL
Specific Gravity, Urine: 1.031 — ABNORMAL HIGH (ref 1.005–1.030)
pH: 8 (ref 5.0–8.0)

## 2016-05-05 LAB — URINALYSIS, MICROSCOPIC (REFLEX)

## 2016-05-05 LAB — PREGNANCY, URINE: PREG TEST UR: NEGATIVE

## 2016-05-05 NOTE — ED Triage Notes (Signed)
States she is here to have treatment for Trichomonas. Vaginal discharge with odor.

## 2017-02-03 ENCOUNTER — Emergency Department (HOSPITAL_COMMUNITY)
Admission: EM | Admit: 2017-02-03 | Discharge: 2017-02-03 | Disposition: A | Discharge status: Home / Self Care | Payer: Self-pay | Attending: Emergency Medicine | Admitting: Emergency Medicine

## 2017-02-03 ENCOUNTER — Other Ambulatory Visit: Payer: Self-pay

## 2017-02-03 DIAGNOSIS — M25531 Pain in right wrist: Secondary | ICD-10-CM | POA: Insufficient documentation

## 2017-02-03 DIAGNOSIS — Z87891 Personal history of nicotine dependence: Secondary | ICD-10-CM | POA: Insufficient documentation

## 2017-02-03 MED ORDER — KETOROLAC TROMETHAMINE 30 MG/ML IJ SOLN
30.0000 mg | Freq: Once | INTRAMUSCULAR | Status: AC
  Administered 2017-02-03: 30 mg via INTRAMUSCULAR
  Filled 2017-02-03: qty 1

## 2017-02-03 NOTE — ED Provider Notes (Signed)
[facility] EMERGENCY DEPARTMENT Provider Note   CSN: [id number] Arrival date & time: 02/03/17  1844     History   Chief Complaint Chief Complaint  Patient presents with  . Carpal Tunnel    HPI Nicole Robinson is a 36 y.o. female.  HPI     36 year old female presents today with complaints of right wrist pain.  Patient is a significant past medical leave carpal tunnel, notes this it feels different.  She reports that pain started last night, along the ulnar aspect of her wrist, worse with movements.  She denies any neurological deficits.  She notes that the pain caused her to drop a container at work and was instructed to come to the emergency room for evaluation.  She denies any trauma to the wrist.  She denies any other injuries.  She notes using Tylenol at home that temporary relieves her symptoms but does not resolve it.      Past Medical History:  Diagnosis Date  . ADD (attention deficit disorder)   . Anxiety   . Chlamydia   . Complication of anesthesia    "resistant to anesthesia"  . Cyst of cervix   . Depression   . Heart murmur   . History of alcohol abuse   . Hx of varicella     Patient Active Problem List   Diagnosis Date Noted  . Cesarean delivery delivered 07/01/2015    Past Surgical History:  Procedure Laterality Date  . CESAREAN SECTION    . CESAREAN SECTION N/A 06/28/2015   Procedure: CESAREAN SECTION;  Surgeon: Bobbye Charleston, MD;  Location: [facility];  Service: Obstetrics;  Laterality: N/A;  . MOLE REMOVAL  06/28/2015   Procedure: MOLE REMOVAL;  Surgeon: Bobbye Charleston, MD;  Location: [facility];  Service: Obstetrics;;  offof abdomen  . WISDOM TOOTH EXTRACTION      OB History    Gravida Para Term Preterm AB Living   6 3 2 1 3 3    SAB TAB Ectopic Multiple Live Births   1 2   0 3       Home Medications    Prior to Admission medications   Medication Sig Start Date End Date Taking? Authorizing  Provider  acetaminophen (TYLENOL) 325 MG tablet Take 650 mg by mouth every 6 (six) hours as needed for mild pain.   Yes [provider]    Family History Family History  Problem Relation Age of Onset  . Diabetes Maternal Aunt   . Diabetes Maternal Grandmother     Social History Social History   Tobacco Use  . Smoking status: Former Smoker    Packs/day: 0.25    Last attempt to quit: 11/21/2014    Years since quitting: 2.2  . Smokeless tobacco: Never Used  Substance Use Topics  . Alcohol use: Yes    Alcohol/week: 3.3 oz    Types: 3 Glasses of wine, 3 Standard drinks or equivalent per week    Comment: occasional  . Drug use: No     Allergies   Other   Review of Systems Review of Systems  All other systems reviewed and are negative.    Physical Exam Updated Vital Signs BP 121/62   Pulse 66   Temp 98.2 F (36.8 C) (Oral)   Resp 18   Ht 5\' 2"  (1.575 m)   Wt 99.3 kg (219 lb)   LMP 01/22/2017 (Exact Date)   SpO2 100%   BMI 40.06 kg/m  Physical Exam  Constitutional: She is oriented to person, place, and time. She appears well-developed and well-nourished.  HENT:  Head: Normocephalic and atraumatic.  Eyes: Conjunctivae are normal. Pupils are equal, round, and reactive to light. Right eye exhibits no discharge. Left eye exhibits no discharge. No scleral icterus.  Neck: Normal range of motion. No JVD present. No tracheal deviation present.  Pulmonary/Chest: Effort normal. No stridor.  Musculoskeletal:  Tenderness to palpation of the ulnar styloid and ulnar hand with no obvious swelling or edema, pain with both ulnar and radial deviation sensation intact  Neurological: She is alert and oriented to person, place, and time. Coordination normal.  Psychiatric: She has a normal mood and affect. Her behavior is normal. Judgment and thought content normal.  Nursing note and vitals reviewed.    ED Treatments / Results  Labs (all labs ordered are listed, but  only abnormal results are displayed) Labs Reviewed - No data to display  EKG  EKG Interpretation None       Radiology No results found.  Procedures Procedures (including critical care time)  Medications Ordered in ED Medications  ketorolac (TORADOL) 30 MG/ML injection 30 mg (30 mg Intramuscular Given 02/03/17 2033)     Initial Impression / Assessment and Plan / ED Course  I have reviewed the triage vital signs and the nursing notes.  Pertinent labs & imaging results that were available during my care of the patient were reviewed by me and considered in my medical decision making (see chart for details).      Final Clinical Impressions(s) / ED Diagnoses   Final diagnoses:  Right wrist pain   36 year old female presents today with wrist pain.  This is likely overuse injury, have very low suspicion for acute fracture, infection, any other lites are in etiology.  Patient will be used in a wrist splint, encouraged to use ibuprofen or Tylenol, ice, rest.  Patient verbalized understanding and agreement to today's plan had no further questions or concerns.  ED Discharge Orders    None       Francee Gentile 02/03/17 2108    Fatima Blank, MD 02/04/17 [phone]

## 2017-02-03 NOTE — ED Notes (Signed)
Declined W/C at D/C and was escorted to lobby by RN. 

## 2017-02-03 NOTE — ED Triage Notes (Signed)
Pt reports carpal tunnel ongoing since last pregnancy, states increased R wrist pain Since yesterday. Denies trauma. Tylenol and ice packs not effective.

## 2017-02-03 NOTE — Progress Notes (Signed)
Orthopedic Tech Progress Note Patient Details:  Nicole Robinson December 16, 1980 [id number]  Ortho Devices Type of Ortho Device: Velcro wrist splint Ortho Device/Splint Location: Right Ortho Device/Splint Interventions: Application, Adjustment   Post Interventions Patient Tolerated: Well Instructions Provided: Adjustment of device, Care of device   Kristopher Oppenheim 02/03/2017, 8:43 PM

## 2017-02-03 NOTE — Discharge Instructions (Signed)
Please read attached information. If you experience any new or worsening signs or symptoms please return to the emergency room for evaluation. Please follow-up with your primary care provider or specialist as discussed.  °

## 2017-02-17 ENCOUNTER — Emergency Department (HOSPITAL_COMMUNITY): Payer: Self-pay

## 2017-02-17 ENCOUNTER — Other Ambulatory Visit: Payer: Self-pay

## 2017-02-17 ENCOUNTER — Encounter (HOSPITAL_COMMUNITY): Payer: Self-pay | Admitting: Emergency Medicine

## 2017-02-17 DIAGNOSIS — Z87891 Personal history of nicotine dependence: Secondary | ICD-10-CM | POA: Insufficient documentation

## 2017-02-17 DIAGNOSIS — R0602 Shortness of breath: Secondary | ICD-10-CM | POA: Insufficient documentation

## 2017-02-17 DIAGNOSIS — S298XXA Other specified injuries of thorax, initial encounter: Secondary | ICD-10-CM | POA: Insufficient documentation

## 2017-02-17 DIAGNOSIS — Y9389 Activity, other specified: Secondary | ICD-10-CM | POA: Insufficient documentation

## 2017-02-17 DIAGNOSIS — Y998 Other external cause status: Secondary | ICD-10-CM | POA: Insufficient documentation

## 2017-02-17 DIAGNOSIS — Y929 Unspecified place or not applicable: Secondary | ICD-10-CM | POA: Insufficient documentation

## 2017-02-17 LAB — CBC
HCT: 37.6 % (ref 36.0–46.0)
HEMOGLOBIN: 11.8 g/dL — AB (ref 12.0–15.0)
MCH: 26 pg (ref 26.0–34.0)
MCHC: 31.4 g/dL (ref 30.0–36.0)
MCV: 82.8 fL (ref 78.0–100.0)
Platelets: 236 10*3/uL (ref 150–400)
RBC: 4.54 MIL/uL (ref 3.87–5.11)
RDW: 14.6 % (ref 11.5–15.5)
WBC: 11.6 10*3/uL — ABNORMAL HIGH (ref 4.0–10.5)

## 2017-02-17 LAB — BASIC METABOLIC PANEL
ANION GAP: 9 (ref 5–15)
BUN: 13 mg/dL (ref 6–20)
CHLORIDE: 103 mmol/L (ref 101–111)
CO2: 22 mmol/L (ref 22–32)
Calcium: 8.5 mg/dL — ABNORMAL LOW (ref 8.9–10.3)
Creatinine, Ser: 1.02 mg/dL — ABNORMAL HIGH (ref 0.44–1.00)
GFR calc non Af Amer: >60 mL/min (ref >60)
Glucose, Bld: 105 mg/dL — ABNORMAL HIGH (ref 65–99)
Potassium: 3.2 mmol/L — ABNORMAL LOW (ref 3.5–5.1)
Sodium: 134 mmol/L — ABNORMAL LOW (ref 135–145)

## 2017-02-17 LAB — I-STAT BETA HCG BLOOD, ED (MC, WL, AP ONLY): I-stat hCG, quantitative: <5 m[IU]/mL (ref <5)

## 2017-02-17 LAB — I-STAT TROPONIN, ED: Troponin i, poc: 0 ng/mL (ref 0.00–0.08)

## 2017-02-17 MED ORDER — OXYCODONE-ACETAMINOPHEN 5-325 MG PO TABS
1.0000 | ORAL_TABLET | ORAL | Status: DC | PRN
  Administered 2017-02-17: 1 via ORAL
  Filled 2017-02-17: qty 1
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
## 2017-02-17 NOTE — ED Notes (Signed)
Patient transported to X-ray 

## 2017-02-18 ENCOUNTER — Emergency Department (HOSPITAL_COMMUNITY)
Admission: EM | Admit: 2017-02-18 | Discharge: 2017-02-18 | Disposition: A | Discharge status: Home / Self Care | Payer: Self-pay | Attending: Emergency Medicine | Admitting: Emergency Medicine

## 2017-02-18 DIAGNOSIS — S298XXA Other specified injuries of thorax, initial encounter: Secondary | ICD-10-CM

## 2017-02-18 MED ORDER — HYDROCODONE-ACETAMINOPHEN 5-325 MG PO TABS: 1.0000 | ORAL_TABLET | Freq: Four times a day (QID) | ORAL | 0 refills | Status: DC | PRN

## 2017-02-18 MED ORDER — IBUPROFEN 600 MG PO TABS: 600.0000 mg | ORAL_TABLET | Freq: Three times a day (TID) | ORAL | 0 refills | Status: DC | PRN
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
## 2017-06-07 ENCOUNTER — Other Ambulatory Visit: Payer: Self-pay

## 2017-06-07 ENCOUNTER — Emergency Department (HOSPITAL_COMMUNITY): Payer: Medicaid Other

## 2017-06-07 ENCOUNTER — Emergency Department (HOSPITAL_COMMUNITY)
Admission: EM | Admit: 2017-06-07 | Discharge: 2017-06-07 | Disposition: A | Discharge status: Home / Self Care | Payer: Medicaid Other | Attending: Emergency Medicine | Admitting: Emergency Medicine

## 2017-06-07 ENCOUNTER — Encounter (HOSPITAL_COMMUNITY): Payer: Self-pay | Admitting: Emergency Medicine

## 2017-06-07 DIAGNOSIS — N760 Acute vaginitis: Secondary | ICD-10-CM | POA: Insufficient documentation

## 2017-06-07 DIAGNOSIS — Z87891 Personal history of nicotine dependence: Secondary | ICD-10-CM | POA: Insufficient documentation

## 2017-06-07 LAB — URINALYSIS, ROUTINE W REFLEX MICROSCOPIC
BILIRUBIN URINE: NEGATIVE
Bacteria, UA: NONE SEEN
Glucose, UA: NEGATIVE mg/dL
HGB URINE DIPSTICK: NEGATIVE
KETONES UR: NEGATIVE mg/dL
NITRITE: NEGATIVE
PROTEIN: NEGATIVE mg/dL
Specific Gravity, Urine: 1.011 (ref 1.005–1.030)
pH: 5 (ref 5.0–8.0)

## 2017-06-07 LAB — I-STAT BETA HCG BLOOD, ED (MC, WL, AP ONLY): I-stat hCG, quantitative: <5 m[IU]/mL (ref <5)

## 2017-06-07 LAB — WET PREP, GENITAL
Clue Cells Wet Prep HPF POC: NONE SEEN
Sperm: NONE SEEN
Trich, Wet Prep: NONE SEEN
Yeast Wet Prep HPF POC: NONE SEEN

## 2017-06-07 MED ORDER — CEFTRIAXONE SODIUM 250 MG IJ SOLR
250.0000 mg | Freq: Once | INTRAMUSCULAR | Status: AC
  Administered 2017-06-07: 250 mg via INTRAMUSCULAR
  Filled 2017-06-07: qty 250

## 2017-06-07 MED ORDER — LIDOCAINE HCL (PF) 1 % IJ SOLN
INTRAMUSCULAR | Status: AC
  Filled 2017-06-07: qty 5

## 2017-06-07 MED ORDER — DOXYCYCLINE HYCLATE 100 MG PO CAPS: 100.0000 mg | ORAL_CAPSULE | Freq: Two times a day (BID) | ORAL | 0 refills | Status: DC

## 2017-06-07 NOTE — ED Triage Notes (Signed)
Pt has had recent URI with fever.  Has had non painful, non odorous vaginal discharge that she attributed to being related to her illness.  Tonight while having relations with her husband she began to have sharp sudden vaginal pain with burning. Continues to "throb"  LMP 3/15.  No current birth control.

## 2017-06-07 NOTE — Discharge Instructions (Addendum)
Return here as needed.  Follow-up with your GYN doctor.

## 2017-06-07 NOTE — ED Provider Notes (Signed)
[facility] EMERGENCY DEPARTMENT Provider Note   CSN: [id number] Arrival date & time: 06/07/17  0436     History   Chief Complaint Chief Complaint  Patient presents with  . Vaginal Discharge    HPI Nicole Robinson is a 37 y.o. female.  HPI Patient presents to the emergency department with vaginal discharge and increasing vaginal pain since attempting to have sex this morning.  The patient states that pain increased this morning after attempting to have sexual intercourse.  Patient states that nothing seems to make the condition better or worse.  He did not take any medications prior to arrival.  She did take a warm bath to see if this would help with minimal relief.  The patient denies chest pain, shortness of breath, headache,blurred vision, neck pain, fever, cough, weakness, numbness, dizziness, anorexia, edema, abdominal pain, nausea, vomiting, diarrhea, rash, back pain, dysuria, hematemesis, bloody stool, near syncope, or syncope. Past Medical History:  Diagnosis Date  . ADD (attention deficit disorder)   . Anxiety   . Chlamydia   . Complication of anesthesia    "resistant to anesthesia"  . Cyst of cervix   . Depression   . Heart murmur   . History of alcohol abuse   . Hx of varicella     Patient Active Problem List   Diagnosis Date Noted  . Cesarean delivery delivered 07/01/2015    Past Surgical History:  Procedure Laterality Date  . CESAREAN SECTION    . CESAREAN SECTION N/A 06/28/2015   Procedure: CESAREAN SECTION;  Surgeon: Bobbye Charleston, MD;  Location: [facility];  Service: Obstetrics;  Laterality: N/A;  . MOLE REMOVAL  06/28/2015   Procedure: MOLE REMOVAL;  Surgeon: Bobbye Charleston, MD;  Location: [facility];  Service: Obstetrics;;  offof abdomen  . WISDOM TOOTH EXTRACTION       OB History    Gravida  6   Para  3   Term  2   Preterm  1   AB  3   Living  3     SAB  1   TAB  2   Ectopic      Multiple  0     Live Births  3            Home Medications    Prior to Admission medications   Medication Sig Start Date End Date Taking? Authorizing Provider  HYDROcodone-acetaminophen (NORCO/VICODIN) 5-325 MG tablet Take 1 tablet by mouth every 6 (six) hours as needed for severe pain. Patient not taking: Reported on 06/07/2017 02/18/17   Ripley Fraise, MD  ibuprofen (ADVIL,MOTRIN) 600 MG tablet Take 1 tablet (600 mg total) by mouth every 8 (eight) hours as needed for moderate pain. Patient not taking: Reported on 06/07/2017 02/18/17   Ripley Fraise, MD    Family History Family History  Problem Relation Age of Onset  . Diabetes Maternal Aunt   . Diabetes Maternal Grandmother     Social History Social History   Tobacco Use  . Smoking status: Former Smoker    Packs/day: 0.25    Last attempt to quit: 11/21/2014    Years since quitting: 2.5  . Smokeless tobacco: Never Used  Substance Use Topics  . Alcohol use: Yes    Alcohol/week: 3.3 oz    Types: 3 Glasses of wine, 3 Standard drinks or equivalent per week    Comment: occasional  . Drug use: No     Allergies   Other  Review of Systems Review of Systems All other systems negative except as documented in the HPI. All pertinent positives and negatives as reviewed in the HPI.  Physical Exam Updated Vital Signs BP (!) 153/101 (BP Location: Right Arm)   Pulse (!) 109   Temp 98.3 F (36.8 C)   Resp 18   SpO2 100%   Physical Exam  Constitutional: She is oriented to person, place, and time. She appears well-developed and well-nourished. No distress.  HENT:  Head: Normocephalic and atraumatic.  Mouth/Throat: Oropharynx is clear and moist.  Eyes: Pupils are equal, round, and reactive to light.  Neck: Normal range of motion. Neck supple.  Cardiovascular: Normal rate, regular rhythm and normal heart sounds. Exam reveals no gallop and no friction rub.  No murmur heard. Pulmonary/Chest: Effort normal and breath sounds  normal. No respiratory distress. She has no wheezes.  Abdominal: Soft. Bowel sounds are normal. She exhibits no distension. There is no tenderness.  Genitourinary: Cervix exhibits motion tenderness and discharge. Cervix exhibits no friability. Right adnexum displays no mass, no tenderness and no fullness. Left adnexum displays no mass, no tenderness and no fullness. There is tenderness in the vagina. No erythema or bleeding in the vagina. No foreign body in the vagina. No signs of injury around the vagina. Vaginal discharge found.  Neurological: She is alert and oriented to person, place, and time. She exhibits normal muscle tone. Coordination normal.  Skin: Skin is warm and dry. Capillary refill takes less than 2 seconds. No rash noted. No erythema.  Psychiatric: She has a normal mood and affect. Her behavior is normal.  Nursing note and vitals reviewed.    ED Treatments / Results  Labs (all labs ordered are listed, but only abnormal results are displayed) Labs Reviewed  WET PREP, GENITAL - Abnormal; Notable for the following components:      Result Value   WBC, Wet Prep HPF POC FEW (*)    All other components within normal limits  URINALYSIS, ROUTINE W REFLEX MICROSCOPIC - Abnormal; Notable for the following components:   Leukocytes, UA TRACE (*)    Squamous Epithelial / LPF 0-5 (*)    All other components within normal limits  I-STAT BETA HCG BLOOD, ED ([facility], [facility], [facility] ONLY)  GC/CHLAMYDIA PROBE AMP ([place]) NOT AT [facility]    EKG None  Radiology No results found.  Procedures Procedures (including critical care time)  Medications Ordered in ED Medications - No data to display   Initial Impression / Assessment and Plan / ED Course  I have reviewed the triage vital signs and the nursing notes.  Pertinent labs & imaging results that were available during my care of the patient were reviewed by me and considered in my medical decision making (see chart for details).     She  will be treated for any possible STDs and she did refuse any further testing and imaging at this point.  She states she needs to be discharged home.  Patient is advised to follow-up with her OB for further evaluation and care.  Final Clinical Impressions(s) / ED Diagnoses   Final diagnoses:  None    ED Discharge Orders    None       Dalia Heading, PA-C 06/07/17 1642    Long, W[facility], MD 06/07/17 [phone]

## 2017-06-07 NOTE — ED Notes (Signed)
Patient transported to Ultrasound 

## 2017-06-08 LAB — GC/CHLAMYDIA PROBE AMP (~~LOC~~) NOT AT ARMC
Chlamydia: NEGATIVE
Neisseria Gonorrhea: NEGATIVE

## 2017-11-19 ENCOUNTER — Ambulatory Visit: Payer: BLUE CROSS/BLUE SHIELD | Admitting: Medical

## 2017-11-19 ENCOUNTER — Encounter: Payer: Self-pay | Admitting: Medical

## 2017-11-19 VITALS — BP 130/82 | HR 82 | Temp 98.1°F | Resp 16 | Ht 63.0 in | Wt 214.6 lb

## 2017-11-19 DIAGNOSIS — Z23 Encounter for immunization: Secondary | ICD-10-CM | POA: Diagnosis not present

## 2017-11-19 DIAGNOSIS — L659 Nonscarring hair loss, unspecified: Secondary | ICD-10-CM

## 2017-11-19 DIAGNOSIS — G8929 Other chronic pain: Secondary | ICD-10-CM | POA: Insufficient documentation

## 2017-11-19 DIAGNOSIS — M5440 Lumbago with sciatica, unspecified side: Secondary | ICD-10-CM

## 2017-11-19 DIAGNOSIS — G479 Sleep disorder, unspecified: Secondary | ICD-10-CM | POA: Diagnosis not present

## 2017-11-19 DIAGNOSIS — M779 Enthesopathy, unspecified: Secondary | ICD-10-CM | POA: Diagnosis not present

## 2017-11-19 DIAGNOSIS — G5601 Carpal tunnel syndrome, right upper limb: Secondary | ICD-10-CM

## 2017-11-19 DIAGNOSIS — M5441 Lumbago with sciatica, right side: Secondary | ICD-10-CM

## 2017-11-19 MED ORDER — GABAPENTIN 100 MG PO CAPS: ORAL_CAPSULE | ORAL | 1 refills | Status: DC

## 2017-11-19 NOTE — Progress Notes (Signed)
Subjective: Chief Complaint  Patient presents with  . NP    NP get established       Here for establish care.   Was using emergency dept as she didn't have insurance.   Was trying homeopathic remedies for things.   Getting older, noticing some ailments as she has gotten older.   Started having carpal tunnel syndrome when pregnant with her 39 year old old, but seems to be getting worse problem.  Sometimes drops things due to weakness of hand.   Is right hand, and this is the hand involved.   Sometimes sharp pain right hand, sometimes drops things, weakness.   Went to emergency dept one night for this, was given brace.   Still has brace, but doesn't wear it ever night.    Having worse issues with back pain.  Losing hair, having some baldness at crown of head.  Has hx/o heart murmur noted prior without symptoms.   Flexeril and gabapentin, legs asleep, pain management [place], [place] 3-4 years ago.  PT done.  MRI lumbar    Past Medical History:  Diagnosis Date  . ADD (attention deficit disorder)   . Anxiety   . Chlamydia   . Complication of anesthesia    "resistant to anesthesia"  . Cyst of cervix   . Depression   . Heart murmur   . History of alcohol abuse   . Hx of varicella     Current Outpatient Medications on File Prior to Visit  Medication Sig Dispense Refill  . doxycycline (VIBRAMYCIN) 100 MG capsule Take 1 capsule (100 mg total) by mouth 2 (two) times daily. (Patient not taking: Reported on 11/19/2017) 20 capsule 0  . HYDROcodone-acetaminophen (NORCO/VICODIN) 5-325 MG tablet Take 1 tablet by mouth every 6 (six) hours as needed for severe pain. (Patient not taking: Reported on 06/07/2017) 5 tablet 0  . ibuprofen (ADVIL,MOTRIN) 600 MG tablet Take 1 tablet (600 mg total) by mouth every 8 (eight) hours as needed for moderate pain. (Patient not taking: Reported on 06/07/2017) 21 tablet 0   No current facility-administered medications on file prior to visit.    ROS as in  subjective   Objective: BP 130/82   Pulse 82   Temp 98.1 F (36.7 C) (Oral)   Resp 16   Ht 5\' 3"  (1.6 m)   Wt 214 lb 9.6 oz (97.3 kg)   SpO2 96%   BMI 38.01 kg/m   General: Well-developed well-nourished no acute distress, obese abdomen female Heart: RRR, normal S1, S2, no obvious murmur but her 26-year-old was loud in the room so I could not really hear Lungs clear Pulses within normal upper and lower extremities Tender lumbar region throughout, pain with range of motion which is relatively full, able to toe walk normal, unable to heel walk on the left, no obvious scoliosis Hips and legs nontender, normal range of motion of hips And legs with normal strength and sensation DTRs Right hand decreased strength compared to left, unable to make a full tight fist, positive Tinel and Phalen's on the right, tender over ulnar side of wrist, pain with range of motion in that area, no swelling, no obvious atrophy, no other deformity otherwise upper extremities with normal strength and sensation There is an area of the superior scalp with hair loss but unable to see fully because she has a hair piece in place   Assessment: Encounter Diagnoses  Name Primary?  . Chronic bilateral low back pain with sciatica, sciatica laterality unspecified Yes  .  Carpal tunnel syndrome of right wrist   . Tendonitis   . Hair loss   . Sleep disturbance   . Need for influenza vaccination      Plan Chronic back pain-we will get a copy of prior MRI.  Based on her history it sounds like she has seen back specialist prior, physical therapy at one point, pain clinic at one point, was on gabapentin at one point.  We will restart gabapentin nightly  Carpal tunnel syndrome-referral to orthopedist, for now continue carpal tunnel wrist splint to be used every night, start gabapentin  Tendinitis of right wrist as well-wrist splint nightly, and again referral to orthopedist  Hair loss-labs today, consider dermatology  consult versus women's Rogaine over-the-counter  Sleep disturbance-discussed sleep hygiene, begin gabapentin  Counseled on the influenza virus vaccine.  Vaccine information sheet given.  Influenza vaccine given after consent obtained.   Samreen was seen today for np.  Diagnoses and all orders for this visit:  Chronic bilateral low back pain with sciatica, sciatica laterality unspecified -     Ambulatory referral to Orthopedic Surgery  Carpal tunnel syndrome of right wrist -     Ambulatory referral to Orthopedic Surgery  Tendonitis  Hair loss -     TSH -     CBC -     Basic metabolic panel  Sleep disturbance -     TSH -     CBC -     Basic metabolic panel  Need for influenza vaccination -     Flu Vaccine QUAD 6+ mos PF IM (Fluarix Quad PF)  Other orders -     gabapentin (NEURONTIN) 100 MG capsule; Begin 1 QHS, after 2 weeks go to 2 capsules QHS

## 2017-11-20 LAB — BASIC METABOLIC PANEL
BUN/Creatinine Ratio: 12 (ref 9–23)
BUN: 9 mg/dL (ref 6–20)
CALCIUM: 9.1 mg/dL (ref 8.7–10.2)
CHLORIDE: 104 mmol/L (ref 96–106)
CO2: 24 mmol/L (ref 20–29)
Creatinine, Ser: 0.73 mg/dL (ref 0.57–1.00)
GFR calc Af Amer: 120 mL/min/{1.73_m2} (ref >59)
GFR calc non Af Amer: 104 mL/min/{1.73_m2} (ref >59)
GLUCOSE: 77 mg/dL (ref 65–99)
POTASSIUM: 4.6 mmol/L (ref 3.5–5.2)
SODIUM: 143 mmol/L (ref 134–144)

## 2017-11-20 LAB — CBC
Hematocrit: 36.5 % (ref 34.0–46.6)
Hemoglobin: 11.3 g/dL (ref 11.1–15.9)
MCH: 24.2 pg — ABNORMAL LOW (ref 26.6–33.0)
MCHC: 31 g/dL — AB (ref 31.5–35.7)
MCV: 78 fL — ABNORMAL LOW (ref 79–97)
Platelets: 260 10*3/uL (ref 150–450)
RBC: 4.66 x10E6/uL (ref 3.77–5.28)
RDW: 14.3 % (ref 12.3–15.4)
WBC: 8.7 10*3/uL (ref 3.4–10.8)

## 2017-11-20 LAB — TSH: TSH: 0.981 u[IU]/mL (ref 0.450–4.500)

## 2017-11-25 ENCOUNTER — Telehealth: Payer: Self-pay | Admitting: Medical

## 2017-11-25 NOTE — Telephone Encounter (Signed)
Recv'd fax that Gabapentin required P.A., called pharmacy because ID# was for Medicaid.  Pt has [employer], I gave pharmacy the correct insurance info and they ran it and went thru for $4.  Called pt and informed.

## 2017-12-07 ENCOUNTER — Ambulatory Visit: Payer: BLUE CROSS/BLUE SHIELD | Admitting: Medical

## 2017-12-07 ENCOUNTER — Encounter: Payer: Self-pay | Admitting: Family Medicine

## 2017-12-07 VITALS — BP 140/90 | HR 74 | Temp 97.5°F | Resp 18 | Ht 63.0 in | Wt 213.4 lb

## 2017-12-07 DIAGNOSIS — G4700 Insomnia, unspecified: Secondary | ICD-10-CM

## 2017-12-07 DIAGNOSIS — F430 Acute stress reaction: Secondary | ICD-10-CM | POA: Diagnosis not present

## 2017-12-07 DIAGNOSIS — F4321 Adjustment disorder with depressed mood: Secondary | ICD-10-CM

## 2017-12-07 MED ORDER — ALPRAZOLAM 0.5 MG PO TABS: 0.5000 mg | ORAL_TABLET | Freq: Every evening | ORAL | 0 refills | Status: DC | PRN

## 2017-12-07 NOTE — Progress Notes (Signed)
Subjective: Chief Complaint  Patient presents with  . sleeping issue    sleep issue   Here for difficulty sleeping.  She called the after-hours line this weekend.     She reports that her brother passed away a week ago unexpectedly.  He was in a MVA years ago, paralyzed.   He lived with his 39 yo daughter in [place], [place].  He passed away unexpectedly a week ago.   Since then [employer] has been a mess.   She cant get her thoughts together, is snappy with people, struggling to keep it together.  Not able to sleep well at all, so mind is racing, can't think clearly to handle the things she needs to do.    She is the one handling his funeral and final needs.     Her father died when she was 69yo, mom died when she was 60yo, another brother died the same year as mom.  Has 1 more brother left.  She was able to get him cremated, but still has to plan the funeral, has to help finalize his financial affairs.   Her brother's 23 yo daughter's mother has drug issues, so not sure what is going to happen to the niece.    Amber's 47yo daughter came home from [employer] service to help out temporarily.    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    She has 3 days of bereavement time, but can't think clearly to return to work.  Needs the money, so can't afford to take off.    Past Medical History:  Diagnosis Date  . ADD (attention deficit disorder)   . Anxiety   . Chlamydia   . Complication of anesthesia    "resistant to anesthesia"  . Cyst of cervix   . Depression   . Heart murmur   . History of alcohol abuse   . Hx of varicella    Current Outpatient Medications on File Prior to Visit  Medication Sig Dispense Refill  . doxycycline (VIBRAMYCIN) 100 MG capsule Take 1 capsule (100 mg total) by mouth 2 (two) times daily. (Patient not taking: Reported on 11/19/2017) 20 capsule 0  . gabapentin (NEURONTIN) 100 MG capsule Begin 1 QHS, after 2 weeks go to 2 capsules QHS (Patient  not taking: Reported on 12/07/2017) 60 capsule 1  . HYDROcodone-acetaminophen (NORCO/VICODIN) 5-325 MG tablet Take 1 tablet by mouth every 6 (six) hours as needed for severe pain. (Patient not taking: Reported on 06/07/2017) 5 tablet 0  . ibuprofen (ADVIL,MOTRIN) 600 MG tablet Take 1 tablet (600 mg total) by mouth every 8 (eight) hours as needed for moderate pain. (Patient not taking: Reported on 06/07/2017) 21 tablet 0   No current facility-administered medications on file prior to visit.    ROS as in subjective   Objective: BP 140/90   Pulse 74   Temp (!) 97.5 F (36.4 C) (Oral)   Resp 18   Ht 5\' 3"  (1.6 m)   Wt 213 lb 6.4 oz (96.8 kg)   SpO2 99%   BMI 37.80 kg/m   Gen: wd, wn Psych: crying and upset    Assessment: Encounter Diagnoses  Name Primary?  . Grieving Yes  . Insomnia, unspecified type   . Acute stress reaction      Plan: We discussed her situation.   I recommended she take some time off work using [employer] or other arrangement with employer to not only grieve but to help with logistics and final arrangements  for brother.   She has not other family really to help, and she is in a position she wasn't ready to take on.   I empathize with her situations.   Medication below to help with sleep and anxiety, but encouraged her to get friends or other family to help get through this process, particularly for her niece who is now faced with no parent at home since father died.     F/u prn   Airiel was seen today for sleeping issue.  Diagnoses and all orders for this visit:  Grieving  Insomnia, unspecified type  Acute stress reaction  Other orders -     ALPRAZolam (XANAX) 0.5 MG tablet; Take 1 tablet (0.5 mg total) by mouth at bedtime as needed for anxiety.

## 2017-12-07 NOTE — Patient Instructions (Signed)
I am sorry to hear about your loss  I recommend you get someone such as a sibling or family member to help you with the acute planning and logistics of everything you are dealing with currently  Have the funeral home help you plan out things to help take the burden off of you  Work a plan to make sure your brother's daughter is safe and has a place to go  You need to take time to grieve yourself  Work with your human resources department on time off to mourn and to deal with this situation.  You need to let your employer know that you have no other family to help handle your brother's situation, and that you are the one making final arrangements and helping to handle your brother's affairs currently.  I can work with you on FMLA, but check to see how your employer wants to work with you.  Begin Xanax medication to help get to sleep.  This is fast acting, may cause drowsiness.     I would like to invite you to my church, [employer] if you are not attending church  Contact info: [address] [place], [place] [address]  [email or web address]     RESOURCES in [place], [place]  If you are experiencing a mental health crisis or an emergency, please call 911 or go to the nearest emergency department.  [facility]   [phone] [facility]  [phone] [facility]   [phone]  Suicide Hotline 1-800-Suicide 907-363-9585)  National Suicide Prevention Lifeline 682 631 5100  541-289-1782)  Domestic Violence, Rape/Crisis - [place] [phone]  The [employer] Violence Hotline 1-800-799-SAFE (813) 042-4063)  To report Child or Elder Abuse, please call: [facility] Police Department  [phone] [place]  [phone]  [place] [phone]  Teen Crisis line 772-382-7412 or [phone]     Counseling Services (NON- psychiatrist offices)  [facility] [place], [place], [place] [address] [phone]   [facility] [address], [place], [place] [address] [phone]   [facility] [phone] [place], [place], [place] [address]   [facility] [phone] [address], [place], [place] [address]   Vic Ripper, therapist [phone] [address], [place], [place] [address]   [facility] [address] [place], [place], [place] [address]

## 2017-12-17 ENCOUNTER — Ambulatory Visit (INDEPENDENT_AMBULATORY_CARE_PROVIDER_SITE_OTHER): Payer: BLUE CROSS/BLUE SHIELD | Admitting: Orthopaedic Surgery

## 2017-12-23 ENCOUNTER — Ambulatory Visit: Payer: BLUE CROSS/BLUE SHIELD | Admitting: Medical

## 2017-12-24 ENCOUNTER — Ambulatory Visit: Payer: BLUE CROSS/BLUE SHIELD | Admitting: Medical

## 2017-12-24 ENCOUNTER — Encounter: Payer: Self-pay | Admitting: Medical

## 2017-12-24 VITALS — BP 120/70 | HR 67 | Temp 98.1°F | Resp 16 | Ht 63.0 in | Wt 212.2 lb

## 2017-12-24 DIAGNOSIS — R4589 Other symptoms and signs involving emotional state: Secondary | ICD-10-CM

## 2017-12-24 DIAGNOSIS — F430 Acute stress reaction: Secondary | ICD-10-CM | POA: Diagnosis not present

## 2017-12-24 DIAGNOSIS — F4321 Adjustment disorder with depressed mood: Secondary | ICD-10-CM | POA: Insufficient documentation

## 2017-12-24 DIAGNOSIS — F329 Major depressive disorder, single episode, unspecified: Secondary | ICD-10-CM

## 2017-12-24 MED ORDER — SERTRALINE HCL 25 MG PO TABS: 25.0000 mg | ORAL_TABLET | Freq: Every day | ORAL | 2 refills | Status: DC

## 2017-12-24 NOTE — Progress Notes (Signed)
Subjective: Chief Complaint  Patient presents with  . follow up    follow up   Here for f/u.  I saw her 2 weeks ago for the issues discussed today.  When I saw her 2 weeks ago she was feeling overwhelmed, her brother just passed away unexpectedly.  She has no family here locally, all out of state.  She feels out of control right now, down mood depressed mood, still grieving the unexpected loss of her brother, still not sure what is going to happen to her niece, and in the meantime she got behind on some bills this past month.  She ended up using a payday loan twice recently to the point where she could not pay her rent this past month.  Is possibly facing eviction.  She has made contact with counseling through helpline at work.  Has not had a face-to-face visit yet though.  Prior to September he was doing fine, was not behind on bills, had a routine going that the death of her brother has really caused a lot of turmoil.  Having some episodes of crying, worrying, snapping more often at children, feels tense.    She works at [employer].  Her employer has been really helpful with her absences currently  From last visit:  She reports that her brother passed away a week ago unexpectedly.  He was in a MVA years ago, paralyzed.   He lived with his 58 yo daughter in [place], [place].  He passed away unexpectedly a week ago.   Since then [employer] has been a mess.   She cant get her thoughts together, is snappy with people, struggling to keep it together.  Not able to sleep well at all, so mind is racing, can't think clearly to handle the things she needs to do.    She is the one handling his funeral and final needs.     Her father died when she was 65yo, mom died when she was 76yo, another brother died the same year as mom.  Has 1 more brother left.  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    Denies HI/SI.  Past Medical History:  Diagnosis Date  . ADD (attention  deficit disorder)   . Anxiety   . Chlamydia   . Complication of anesthesia    "resistant to anesthesia"  . Cyst of cervix   . Depression   . Heart murmur   . History of alcohol abuse   . Hx of varicella    Current Outpatient Medications on File Prior to Visit  Medication Sig Dispense Refill  . ALPRAZolam (XANAX) 0.5 MG tablet Take 1 tablet (0.5 mg total) by mouth at bedtime as needed for anxiety. (Patient not taking: Reported on 12/24/2017) 20 tablet 0  . gabapentin (NEURONTIN) 100 MG capsule Begin 1 QHS, after 2 weeks go to 2 capsules QHS (Patient not taking: Reported on 12/07/2017) 60 capsule 1   No current facility-administered medications on file prior to visit.    ROS as in subjective   Objective: BP 120/70   Pulse 67   Temp 98.1 F (36.7 C) (Oral)   Resp 16   Ht 5\' 3"  (1.6 m)   Wt 212 lb 3.2 oz (96.3 kg)   LMP 12/08/2017 (Exact Date)   SpO2 100%   BMI 37.59 kg/m   Gen: wd, wn Psych: crying and upset, answers questions appropriately    Assessment: Encounter Diagnoses  Name Primary?  . Grieving Yes  .  Acute stress reaction   . Depressed mood      Plan: We discussed her situation, current symptoms.  Begin trial of Zoloft, continue Xanax as needed.  She has done some phone counseling but advise she go ahead and try to schedule a face-to-face appointment over the next day or 2 with counselor.  Recommendations  You need to refocus your energy and try to simplify things currently been taking one thing at a time  Just some general advice:  I would speak with your landlord about your situation, remind him how good your being on time with your rent prior to last few weeks when you first learned about your brother  Asked him to work with you during this difficult time for the next month until things get straightened out and calm down  I would also call whoever you have reoccurring monthly bills with and ask if they can work with you for a month or 2 to get  things straightened out due to the recent stresses you have.  For example work with the [place] with whatever recurring bills you have  I would avoid short-term lenders, avoid payday loans.  Payday loan places such as what you are doing with gently are bad ideas, have very high interest rates and can really get you in a pickle with debt.  If you need short-term long-term, I would work through a credit union or possibly a friend  Look into food banks, churches, Clinical cytogeneticist locally for help including social services.  They may be able to help with rent or food or just giving general advice they can get you through this hard time  When you see the counselor, asked him to work with you on whether or not you need time out of work or reduced schedule to help get you through this time  Begin sertraline/Zoloft 1 tablet daily at bedtime.  You can use the Xanax as needed for panic feelings  I recommend you get an appointment with a counselor right away preferably face-to-face.  Please work with the counselor to get appointment within the next few days.  Be persistent with them until you get an appointment   Jamesia was seen today for follow up.  Diagnoses and all orders for this visit:  Grieving  Acute stress reaction  Depressed mood  Other orders -     sertraline (ZOLOFT) 25 MG tablet; Take 1 tablet (25 mg total) by mouth daily.  Follow-up 2 weeks

## 2017-12-24 NOTE — Patient Instructions (Signed)
Recommendations  You need to refocus your energy and try to simplify things currently been taking one thing at a time  Just some general advice:  I would speak with your landlord about your situation, remind him how good your being on time with your rent prior to last few weeks when you first learned about your brother  Asked him to work with you during this difficult time for the next month until things get straightened out and calm down  I would also call whoever you have reoccurring monthly bills with and ask if they can work with you for a month or 2 to get things straightened out due to the recent stresses you have.  For example work with the [place] with whatever recurring bills you have  I would avoid short-term lenders, avoid payday loans.  Payday loan places such as what you are doing with gently are bad ideas, have very high interest rates and can really get you in a pickle with debt.  If you need short-term long-term, I would work through a credit union or possibly a friend  Look into food banks, churches, Clinical cytogeneticist locally for help including social services.  They may be able to help with rent or food or just giving general advice they can get you through this hard time  When you see the counselor, asked him to work with you on whether or not you need time out of work or reduced schedule to help get you through this time  Begin sertraline/Zoloft 1 tablet daily at bedtime.  You can use the Xanax as needed for panic feelings  I recommend you get an appointment with a counselor right away preferably face-to-face.  Please work with the counselor to get appointment within the next few days.  Be persistent with them until you get an appointment    RESOURCES in [place], [place]  If you are experiencing a mental health crisis or an emergency, please call 911 or go to the nearest emergency department.  [facility]    [phone] [facility]  [phone] [facility]   [phone]  Suicide Hotline 1-800-Suicide (239) 579-1842)  National Suicide Prevention Lifeline (775)869-4150  571-611-9394)  Domestic Violence, Rape/Crisis - [place] (702) 430-1736  The [employer] Violence Hotline 1-800-799-SAFE (678)503-5650)  To report Child or Elder Abuse, please call: [facility] Police Department  [phone] [facility] Department  [place] [phone]  Teen Crisis line (332) 790-7086 or ([phone]     Psychiatry and Counseling services  [facility] [place], [place], [place] [address] [phone]  Lina Sayre, therapist Dr. Lynder Parents, psychiatrist Dr. Milana Huntsman, child psychiatrist   Dr. Launa Flight [address], [place], [place] [address] [phone]   Dr. Chucky May, psychiatry [address] Carolynne Edouard [place], [place] [address] [phone]   [place], [place], [place] [address] [phone]   [facility] [place], [place], [place] [address] [phone]    Counseling Services (NON- psychiatrist offices)  [facility] [address] [place], [place] [address] [phone]   [facility] [phone] [place], [place], [place] [address]   [facility] [phone]  [email or web address] [address], [place], [place], [place] [address]   Merrianne M. Clarene Reamer, therapist [phone] [address] [place], [place] [address]   [facility] (  [phone] [address], [place], [place] [address]   Vic Ripper, therapist [phone] [address] [place], [place] [address]   [facility] [phone] [address] [place], [place], [place] [address]

## 2017-12-31 ENCOUNTER — Ambulatory Visit (INDEPENDENT_AMBULATORY_CARE_PROVIDER_SITE_OTHER): Payer: BLUE CROSS/BLUE SHIELD | Admitting: Orthopaedic Surgery

## 2018-01-07 ENCOUNTER — Ambulatory Visit (INDEPENDENT_AMBULATORY_CARE_PROVIDER_SITE_OTHER): Payer: BLUE CROSS/BLUE SHIELD | Admitting: Orthopaedic Surgery

## 2018-01-14 ENCOUNTER — Ambulatory Visit (INDEPENDENT_AMBULATORY_CARE_PROVIDER_SITE_OTHER): Payer: Self-pay

## 2018-01-14 ENCOUNTER — Encounter (INDEPENDENT_AMBULATORY_CARE_PROVIDER_SITE_OTHER): Payer: Self-pay | Admitting: Orthopaedic Surgery

## 2018-01-14 ENCOUNTER — Ambulatory Visit (INDEPENDENT_AMBULATORY_CARE_PROVIDER_SITE_OTHER): Payer: BLUE CROSS/BLUE SHIELD | Admitting: Orthopaedic Surgery

## 2018-01-14 DIAGNOSIS — G8929 Other chronic pain: Secondary | ICD-10-CM

## 2018-01-14 DIAGNOSIS — M5441 Lumbago with sciatica, right side: Secondary | ICD-10-CM | POA: Diagnosis not present

## 2018-01-14 DIAGNOSIS — M5442 Lumbago with sciatica, left side: Secondary | ICD-10-CM

## 2018-01-14 MED ORDER — METHOCARBAMOL 500 MG PO TABS: 500.0000 mg | ORAL_TABLET | Freq: Two times a day (BID) | ORAL | 0 refills | Status: DC | PRN

## 2018-01-14 MED ORDER — PREDNISONE 10 MG (21) PO TBPK: ORAL_TABLET | ORAL | 0 refills | Status: DC

## 2018-01-14 NOTE — Progress Notes (Signed)
Office Visit Note   Patient: Nicole Robinson           Date of Birth: 01/26/1981           MRN: [id number] Visit Date: 01/14/2018              Requested by: Carlena Hurl, PA-C [address] [place], [place] [address] PCP: Carlena Hurl, PA-C   Assessment & Plan: Visit Diagnoses:  1. Chronic bilateral low back pain with bilateral sciatica     Plan: Impression is lower back pain with right lower extremity radiculopathy.  We will start the patient on a Sterapred taper muscle relaxer.  I will also send her to formal physical therapy.  She will follow-up with [place] in 6 to 8 weeks time for recheck if she is not any better.  Follow-Up Instructions: Return if symptoms worsen or fail to improve.   Orders:  Orders Placed This Encounter  Procedures  . XR Lumbar Spine 2-3 Views   Meds ordered this encounter  Medications  . methocarbamol (ROBAXIN) 500 MG tablet    Sig: Take 1 tablet (500 mg total) by mouth 2 (two) times daily as needed for muscle spasms.    Dispense:  20 tablet    Refill:  0  . predniSONE (STERAPRED UNI-PAK 21 TAB) 10 MG (21) TBPK tablet    Sig: Take as directed    Dispense:  21 tablet    Refill:  0      Procedures: No procedures performed   Clinical Data: No additional findings.   Subjective: Chief Complaint  Patient presents with  . Lower Back - Pain    HPI patient is a pleasant 37 year old female who presents to our clinic today with lower back pain.  This is been ongoing for the past several years.  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  She is unsure if this is what started the pain.  Pain she has is to the middle of her lower back and radiates down to her right buttocks and down the back of her right leg.  Pain is worse when she moves the wrong way or when she sits up too quickly.  She has been taking Tylenol, ibuprofen and gabapentin with minimal relief of symptoms.  She denies any weakness  to either lower extremity.  No bowel or bladder change and no saddle paresthesias.  No previous physical therapy, ESI or MRI of the lower back.  She does note that she was referred to pain management at one point but lost her insurance so she no longer goes there.  Review of Systems as detailed in HPI.  All others reviewed and are negative.   Objective: Vital Signs: There were no vitals taken for this visit.  Physical Exam well-developed well-nourished female in no acute distress.  Alert and oriented x3.  Ortho Exam examination of her lower back reveals mild spinous tenderness.  Increased pain with lumbar flexion and rotation.  Positive straight leg raise on the right.  No focal weakness.  She is neurovascularly intact distally.  Specialty Comments:  No specialty comments available.  Imaging: Xr Lumbar Spine 2-3 Views  Result Date: 01/14/2018 No acute or structural abnormalities    PMFS History: Patient Active Problem List   Diagnosis Date Noted  . Grieving 12/24/2017  . Acute stress reaction 12/24/2017  . Depressed mood 12/24/2017  . Chronic bilateral low back pain with sciatica  11/19/2017  . Carpal tunnel syndrome of right wrist 11/19/2017  . Tendonitis 11/19/2017  . Hair loss 11/19/2017  . Sleep disturbance 11/19/2017  . Need for influenza vaccination 11/19/2017  . Cesarean delivery delivered 07/01/2015   Past Medical History:  Diagnosis Date  . ADD (attention deficit disorder)   . Anxiety   . Chlamydia   . Complication of anesthesia    "resistant to anesthesia"  . Cyst of cervix   . Depression   . Heart murmur   . History of alcohol abuse   . Hx of varicella     Family History  Problem Relation Age of Onset  . Diabetes Maternal Aunt   . Diabetes Maternal Grandmother     Past Surgical History:  Procedure Laterality Date  . CESAREAN SECTION    . CESAREAN SECTION N/A 06/28/2015   Procedure: CESAREAN SECTION;  Surgeon: Bobbye Charleston, MD;  Location: [facility];  Service: Obstetrics;  Laterality: N/A;  . MOLE REMOVAL  06/28/2015   Procedure: MOLE REMOVAL;  Surgeon: Bobbye Charleston, MD;  Location: [facility];  Service: Obstetrics;;  offof abdomen  . WISDOM TOOTH EXTRACTION     Social History   Occupational History  . Not on file  Tobacco Use  . Smoking status: Current Every Day Smoker    Packs/day: 0.25    Last attempt to quit: 11/21/2014    Years since quitting: 3.1  . Smokeless tobacco: Never Used  Substance and Sexual Activity  . Alcohol use: Yes    Alcohol/week: 6.0 standard drinks    Types: 3 Glasses of wine, 3 Standard drinks or equivalent per week    Comment: occasional  . Drug use: No  . Sexual activity: Yes

## 2018-01-19 ENCOUNTER — Other Ambulatory Visit: Payer: Self-pay | Admitting: Medical

## 2018-01-19 NOTE — Telephone Encounter (Signed)
Is this ok to refill?  

## 2018-01-20 ENCOUNTER — Telehealth: Payer: Self-pay | Admitting: Medical

## 2018-01-20 NOTE — Telephone Encounter (Signed)
There are plenty of private counselors that can probably work with her schedule.  So I would recommend she seek out private counseling  I will have to defer her pain to the orthopedic I sent her to.  I do see where they prescribed a prednisone steroid pack to help calm down her pain and they recommended she see physical therapy which is what I would do

## 2018-01-20 NOTE — Telephone Encounter (Signed)
Patient states that her job counselor is only available in the afternoon and she cant do that.  She states that the zoloft seems to be working because she cant get upset.   She also states that she went to ortho and the gabapentin is not helping and wants to know what to do next.

## 2018-01-20 NOTE — Telephone Encounter (Signed)
Call and see how she is doing, make sure she is seeing counselor.

## 2018-01-20 NOTE — Telephone Encounter (Signed)
Patient notified of recommendations. 

## 2018-03-23 ENCOUNTER — Other Ambulatory Visit: Payer: Self-pay | Admitting: Medical

## 2018-03-23 NOTE — Telephone Encounter (Signed)
Get her back in for med check.   Send 30 day supply

## 2018-03-26 NOTE — Telephone Encounter (Signed)
Patient has an appointment scheduled for 03/29/18.

## 2018-03-29 ENCOUNTER — Ambulatory Visit: Payer: BLUE CROSS/BLUE SHIELD | Admitting: Medical

## 2018-03-29 VITALS — BP 120/76 | HR 65 | Temp 98.4°F | Resp 16 | Ht 63.0 in | Wt 215.4 lb

## 2018-03-29 DIAGNOSIS — G5601 Carpal tunnel syndrome, right upper limb: Secondary | ICD-10-CM | POA: Diagnosis not present

## 2018-03-29 DIAGNOSIS — G8929 Other chronic pain: Secondary | ICD-10-CM

## 2018-03-29 DIAGNOSIS — M5440 Lumbago with sciatica, unspecified side: Secondary | ICD-10-CM | POA: Diagnosis not present

## 2018-03-29 DIAGNOSIS — G479 Sleep disorder, unspecified: Secondary | ICD-10-CM | POA: Diagnosis not present

## 2018-03-29 DIAGNOSIS — F430 Acute stress reaction: Secondary | ICD-10-CM

## 2018-03-29 MED ORDER — GABAPENTIN 100 MG PO CAPS: 300.0000 mg | ORAL_CAPSULE | Freq: Every day | ORAL | 0 refills | Status: DC

## 2018-03-29 NOTE — Patient Instructions (Signed)
recommendations  increased Gabapentin to 300mg  or 3 capsules nightly  Continue Sertraline unchanged   Call, return or recheck in 3 week on how this is helping with sleep

## 2018-03-29 NOTE — Progress Notes (Signed)
Subjective: Chief Complaint  Patient presents with  . med check    med check    Here for med check.   I last saw her in 2022-12-25, shortly after her brother passed away.  At that time Nicole Robinson was have a hard time with everything, having to be involved in the funeral details, helping to take care of her niece.    Back in 12-25-22 her brother died unexpectedly.  They did not know at the time that he was abusing cocaine and heroin.  He had been on mental health medicines but had stopped these.  He lived with his 56 yo daughter in [place], [place].    At that time Nicole Robinson was having trouble holding it together, was upset but also panicky and unsure what to do as this was unexpected, she had to help plan the funeral, she also had to help figure out what his 32 year old daughter was going to do now as her mother was not in position to be a mother at this time.  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    Currently overall, doing ok since last visit.   Currently still taking Sertraline, but not sure she still wants to be taking this.  Not currently seeing a counselor.   She has been talking with her family and going to work as usual to stay stable with things  Still having trouble sleeping.   Has used melatonin in the past.  Has had sleep issues for years, has always attributes this to anxiety, restless mind, but still having trouble with sleep.  Lies in the bed, tosses and turns.  Usually active daily.   Has used Trazodone in the past.    Found at that cause of death of her brother was accidental overdose of cocaine and heroin.  Has chronic back pain and hand pain - still takes Gabapentin 100mg , 2 capsules QHS.  Has carpal tunnel as well.  Has seen Dr. Erlinda Hong at orthopedics, recently referred to physical for back.   Has a brace for hand for CTS.    Her niece is trying to work out something to come stay with Museum/gallery conservator, but currently her biological mother is overwhelmed and not in a  good position to take care of her daughter.  No other c/o  Past Medical History:  Diagnosis Date  . ADD (attention deficit disorder)   . Anxiety   . Chlamydia   . Complication of anesthesia    "resistant to anesthesia"  . Cyst of cervix   . Depression   . Heart murmur   . History of alcohol abuse   . Hx of varicella    Current Outpatient Medications on File Prior to Visit  Medication Sig Dispense Refill  . sertraline (ZOLOFT) 25 MG tablet TAKE 1 TABLET(25 MG) BY MOUTH DAILY 30 tablet 0  . ALPRAZolam (XANAX) 0.5 MG tablet Take 1 tablet (0.5 mg total) by mouth at bedtime as needed for anxiety. (Patient not taking: Reported on 12/24/2017) 20 tablet 0  . methocarbamol (ROBAXIN) 500 MG tablet Take 1 tablet (500 mg total) by mouth 2 (two) times daily as needed for muscle spasms. (Patient not taking: Reported on 03/29/2018) 20 tablet 0   No current facility-administered medications on file prior to visit.    ROS as in subjective   Objective: BP 120/76   Pulse 65   Temp 98.4 F (36.9 C) (Oral)   Resp 16   Ht 5\' 3"  (1.6 m)   Wt  215 lb 6.4 oz (97.7 kg)   LMP 03/29/2018 (Exact Date)   SpO2 97%   BMI 38.16 kg/m    Wt Readings from Last 3 Encounters:  03/29/18 215 lb 6.4 oz (97.7 kg)  12/24/17 212 lb 3.2 oz (96.3 kg)  12/07/17 213 lb 6.4 oz (96.8 kg)   Gen: wd, wn, nad  Psych: Pleasant, answers questions appropriate, seems calm and stable compared to prior visit   Assessment: Encounter Diagnoses  Name Primary?  . Sleep disturbance Yes  . Acute stress reaction   . Chronic bilateral low back pain with sciatica, sciatica laterality unspecified   . Carpal tunnel syndrome of right wrist      Plan: We discussed different ways to help with sleep disturbance and counseled on sleep hygiene.  After discussing options including trazodone, other sleep aids, we decided to increase her gabapentin to 300 mg nightly.  We will keep the sertraline the same for now.  We discussed risk and  benefits of medications.  She would like to get off sertraline but now might not be the best time.  She also has questions about smoking cessation but this probably is not the best time to stop that as well.  I advised she call back in 2 to 3 weeks or return to let me know how sleep is going.  Over the next several months we can transition to trying to stop tobacco and trying to get off sertraline when she is ready  Glad to hear she is doing better.  Again advise she consider counseling, work on getting regular exercise which is a great way to help with stress relief, taking some time for herself and trying to have balance in her life.  She is working a lot of overtime currently.  She is also trying to get her needs to move down lives with her which may be good for both of them  F/u with ortho as planned  Nicole Robinson was seen today for med check.  Diagnoses and all orders for this visit:  Sleep disturbance  Acute stress reaction  Chronic bilateral low back pain with sciatica, sciatica laterality unspecified  Carpal tunnel syndrome of right wrist  Other orders -     gabapentin (NEURONTIN) 100 MG capsule; Take 3 capsules (300 mg total) by mouth at bedtime.  call report or recheck within 3 weeks.

## 2018-04-15 ENCOUNTER — Other Ambulatory Visit: Payer: Self-pay | Admitting: Medical

## 2018-04-15 MED ORDER — TRAZODONE HCL 50 MG PO TABS: ORAL_TABLET | ORAL | 1 refills | Status: DC

## 2018-04-16 ENCOUNTER — Other Ambulatory Visit: Payer: Self-pay | Admitting: Medical

## 2018-04-16 DIAGNOSIS — Z1231 Encounter for screening mammogram for malignant neoplasm of breast: Secondary | ICD-10-CM

## 2018-04-17 ENCOUNTER — Other Ambulatory Visit: Payer: Self-pay | Admitting: Medical

## 2018-04-19 ENCOUNTER — Other Ambulatory Visit: Payer: Self-pay | Admitting: Medical

## 2018-04-19 ENCOUNTER — Other Ambulatory Visit: Payer: Self-pay | Admitting: Family Medicine

## 2018-04-19 DIAGNOSIS — Z1231 Encounter for screening mammogram for malignant neoplasm of breast: Secondary | ICD-10-CM

## 2018-04-19 MED ORDER — SERTRALINE HCL 25 MG PO TABS: ORAL_TABLET | ORAL | 2 refills | Status: DC

## 2018-04-19 MED ORDER — GABAPENTIN 300 MG PO CAPS: 300.0000 mg | ORAL_CAPSULE | Freq: Every day | ORAL | 2 refills | Status: DC

## 2018-04-19 NOTE — Telephone Encounter (Signed)
Is this ok to refill?  

## 2018-04-20 ENCOUNTER — Ambulatory Visit
Admission: RE | Admit: 2018-04-20 | Discharge: 2018-04-20 | Disposition: A | Discharge status: Home / Self Care | Payer: BLUE CROSS/BLUE SHIELD | Source: Ambulatory Visit | Attending: Family Medicine | Admitting: Family Medicine

## 2018-04-20 DIAGNOSIS — Z1231 Encounter for screening mammogram for malignant neoplasm of breast: Secondary | ICD-10-CM

## 2018-05-17 ENCOUNTER — Telehealth: Payer: BLUE CROSS/BLUE SHIELD | Admitting: Family

## 2018-05-17 ENCOUNTER — Other Ambulatory Visit: Payer: Self-pay | Admitting: Family

## 2018-05-17 DIAGNOSIS — F411 Generalized anxiety disorder: Secondary | ICD-10-CM | POA: Diagnosis not present

## 2018-05-17 DIAGNOSIS — J069 Acute upper respiratory infection, unspecified: Secondary | ICD-10-CM

## 2018-05-17 MED ORDER — BENZONATATE 100 MG PO CAPS: 100.0000 mg | ORAL_CAPSULE | Freq: Three times a day (TID) | ORAL | 0 refills | Status: DC | PRN

## 2018-05-17 MED ORDER — FLUTICASONE PROPIONATE 50 MCG/ACT NA SUSP: 2.0000 | Freq: Every day | NASAL | 0 refills | Status: DC

## 2018-05-17 NOTE — Progress Notes (Signed)

## 2018-05-19 DIAGNOSIS — F411 Generalized anxiety disorder: Secondary | ICD-10-CM | POA: Diagnosis not present

## 2018-05-21 ENCOUNTER — Telehealth: Payer: BLUE CROSS/BLUE SHIELD | Admitting: Physician Assistant

## 2018-05-21 DIAGNOSIS — R509 Fever, unspecified: Secondary | ICD-10-CM

## 2018-05-21 MED ORDER — PROMETHAZINE-DM 6.25-15 MG/5ML PO SYRP: 5.0000 mL | ORAL_SOLUTION | Freq: Four times a day (QID) | ORAL | 0 refills | Status: DC | PRN

## 2018-05-21 NOTE — Progress Notes (Signed)

## 2018-05-21 NOTE — Progress Notes (Signed)
I have spent 5 minutes in review of e-visit questionnaire, review and updating patient chart, medical decision making and response to patient.   Ifeoluwa Bartz Cody Tarica Harl, PA-C    

## 2018-05-24 DIAGNOSIS — F411 Generalized anxiety disorder: Secondary | ICD-10-CM | POA: Diagnosis not present

## 2018-05-25 ENCOUNTER — Telehealth: Payer: Self-pay

## 2018-05-25 NOTE — Telephone Encounter (Signed)
If you think she needs a virtual visit then set this up please for tomorrow with me  It is hard for me to make a decision on going back to work without doing a visit with her virtually  Particular if you think she has a sinus infection now than yes we can handle this over the virtual visit platform

## 2018-05-25 NOTE — Telephone Encounter (Signed)
Patient called and stated she did 2 E-visits in the pat week and was treated with flonase and cough meds. However, patient is experiencing symptoms of Sinus infection including sinus pressure behind eyes and face with congestion. Patient stated she was not give an antibiotic. Patient also stated the last E-visit she had, she was told that she could of had COV-19. After assessing patient this morning, she stated her symptoms got better with the use of flonase and cough meds however she was wanting to know if she needs to stay at home or can she got to work because her job won't pay for her to be out of work.

## 2018-05-26 ENCOUNTER — Ambulatory Visit: Payer: BLUE CROSS/BLUE SHIELD | Admitting: Medical

## 2018-05-26 ENCOUNTER — Other Ambulatory Visit: Payer: Self-pay | Admitting: Medical

## 2018-05-26 ENCOUNTER — Encounter: Payer: Self-pay | Admitting: Medical

## 2018-05-26 ENCOUNTER — Other Ambulatory Visit: Payer: Self-pay

## 2018-05-26 VITALS — Temp 97.4°F | Ht 63.0 in | Wt 227.0 lb

## 2018-05-26 DIAGNOSIS — R05 Cough: Secondary | ICD-10-CM

## 2018-05-26 DIAGNOSIS — J301 Allergic rhinitis due to pollen: Secondary | ICD-10-CM

## 2018-05-26 DIAGNOSIS — R059 Cough, unspecified: Secondary | ICD-10-CM

## 2018-05-26 MED ORDER — LEVOCETIRIZINE DIHYDROCHLORIDE 5 MG PO TABS: 5.0000 mg | ORAL_TABLET | Freq: Every evening | ORAL | 1 refills | Status: DC

## 2018-05-26 NOTE — Telephone Encounter (Signed)
Is this ok?

## 2018-05-26 NOTE — Telephone Encounter (Signed)
If not covered, use Cetirizine 10mg  daily

## 2018-05-26 NOTE — Progress Notes (Signed)
Subjective:  Nicole Robinson is a 38 y.o. female who presents for sinusitis  Documentation for virtual audio and video telecommunications through Zoom encounter:  The patient was located at home. The provider was located in the office. The patient did consent to this visit and is aware of possible charges through their insurance for this visit.  The other persons participating in this telemedicine service were none. Time spent on call was 12 minutes and in review of previous records >18 minutes total.  This virtual service is not related to other E/M service within previous 7 days.  Chief Complaint  Patient presents with  . sinus    cough, congestion, drainage X 1 week    Last week about 6 days ago started with sinus pressure, stuffy head, nasal congestion,   Did e-visit through my chart last week.   Was prescribed mucinex OTC, warm water and salt for sore throat, pressure behind eyes, was prescribed tessalon perles.  Was advised if not clearing, then call PCP.  Called back to e-visit later that week, prescribed nasal spray.   Over the weekend seemed to get some good improvement.  However, currently still has post nasal drainage, some cough.  Has some head congestion, some sinus pressure.  When using the Flonase nasal spray gets almost immediate relief within the next few minutes.  But is using this twice a day for the time being.  She never really use the Mucinex that she was advised.  She is not taking an allergy pill.  Both of her children and husband use allergy medication daily in the spring.  Overall she does not feel sick, no fever, no body aches or chills.  No exposure to coronavirus.    Nonsmoker.  No other aggravating or relieving factors. No other complaint.   Past Medical History:  Diagnosis Date  . ADD (attention deficit disorder)   . Anxiety   . Chlamydia   . Complication of anesthesia    "resistant to anesthesia"  . Cyst of cervix   . Depression   . Heart murmur    . History of alcohol abuse   . Hx of varicella     Current Outpatient Medications on File Prior to Visit  Medication Sig Dispense Refill  . benzonatate (TESSALON) 100 MG capsule Take 1 capsule (100 mg total) by mouth 3 (three) times daily as needed for cough. 20 capsule 0  . fluticasone (FLONASE) 50 MCG/ACT nasal spray Place 2 sprays into both nostrils daily. 16 g 0  . gabapentin (NEURONTIN) 300 MG capsule Take 1 capsule (300 mg total) by mouth at bedtime. 30 capsule 2  . sertraline (ZOLOFT) 25 MG tablet TAKE 1 TABLET(25 MG) BY MOUTH DAILY 30 tablet 2  . traZODone (DESYREL) 50 MG tablet 1/2-1 tablet po QHS 30 tablet 1   No current facility-administered medications on file prior to visit.     ROS as in subjective   Objective: Temp (!) 97.4 F (36.3 C) (Oral)   Ht 5\' 3"  (1.6 m)   Wt 227 lb (103 kg)   LMP 04/26/2018 (Exact Date)   BMI 40.21 kg/m   General appearance: Alert, well developed, well nourished, no distress       On the phone she does not sound terribly stuffy, no obvious wheezing sounds.  Answers questions appropriately, pleasant.        Assessment  Encounter Diagnoses  Name Primary?  . Allergic rhinitis due to pollen, unspecified seasonality Yes  . Cough  Plan: Her symptoms and prior discussion it sounds like she has allergic rhinitis.  She has some postnasal drainage and cough related to the allergies but the cough is intermittent, minimal.  Begin Xyzal at bedtime for the next 1 to 2 months, continue Flonase she is already using once daily.  Discussed daily shower and use nasal saline to flush out pollen from the nose.  Discussed symptoms of sinus infection versus allergies.  Advised that she could work at this time with the symptoms as they are consistent with allergies and not infection  Patient was advised to call or recheck if worse or not improving.    Patient voiced understanding of diagnosis, recommendations, and treatment plan.

## 2018-05-27 DIAGNOSIS — F411 Generalized anxiety disorder: Secondary | ICD-10-CM | POA: Diagnosis not present

## 2018-05-27 NOTE — Telephone Encounter (Signed)
Pt was seen on 4/1

## 2018-05-31 DIAGNOSIS — F411 Generalized anxiety disorder: Secondary | ICD-10-CM | POA: Diagnosis not present

## 2018-06-02 DIAGNOSIS — F411 Generalized anxiety disorder: Secondary | ICD-10-CM | POA: Diagnosis not present

## 2018-06-04 ENCOUNTER — Other Ambulatory Visit: Payer: Self-pay | Admitting: Medical

## 2018-06-07 DIAGNOSIS — F411 Generalized anxiety disorder: Secondary | ICD-10-CM | POA: Diagnosis not present

## 2018-06-07 NOTE — Telephone Encounter (Signed)
Is this okay to refill? 

## 2018-06-08 ENCOUNTER — Telehealth: Payer: Self-pay | Admitting: Medical

## 2018-06-09 DIAGNOSIS — F411 Generalized anxiety disorder: Secondary | ICD-10-CM | POA: Diagnosis not present

## 2018-06-11 ENCOUNTER — Other Ambulatory Visit: Payer: Self-pay | Admitting: Family

## 2018-06-11 NOTE — Telephone Encounter (Signed)
error 

## 2018-06-17 NOTE — Telephone Encounter (Signed)
See my prior message

## 2018-06-17 NOTE — Telephone Encounter (Signed)
I  Have refill request on trazodone, but after her last my chart e-mail she noted it was helping.   I had recommended virtual visit to discuss options.   See if she wants this refilled, does she want to do tele visit to discuss if not helping?

## 2018-06-17 NOTE — Telephone Encounter (Signed)
Is this ok to refill?  

## 2018-06-18 NOTE — Telephone Encounter (Signed)
Cyndi, can you see my prior message?   You re-routed this to me again, but the question I had didn't get answered.

## 2018-06-21 ENCOUNTER — Other Ambulatory Visit: Payer: Self-pay

## 2018-06-21 ENCOUNTER — Encounter: Payer: Self-pay | Admitting: Medical

## 2018-06-21 ENCOUNTER — Ambulatory Visit (INDEPENDENT_AMBULATORY_CARE_PROVIDER_SITE_OTHER): Payer: BLUE CROSS/BLUE SHIELD | Admitting: Medical

## 2018-06-21 VITALS — Temp 98.1°F | Ht 63.0 in | Wt 227.0 lb

## 2018-06-21 DIAGNOSIS — R4589 Other symptoms and signs involving emotional state: Secondary | ICD-10-CM

## 2018-06-21 DIAGNOSIS — G479 Sleep disorder, unspecified: Secondary | ICD-10-CM

## 2018-06-21 DIAGNOSIS — F329 Major depressive disorder, single episode, unspecified: Secondary | ICD-10-CM

## 2018-06-21 DIAGNOSIS — G4700 Insomnia, unspecified: Secondary | ICD-10-CM | POA: Insufficient documentation

## 2018-06-21 DIAGNOSIS — M5440 Lumbago with sciatica, unspecified side: Secondary | ICD-10-CM | POA: Diagnosis not present

## 2018-06-21 DIAGNOSIS — G8929 Other chronic pain: Secondary | ICD-10-CM

## 2018-06-21 DIAGNOSIS — F419 Anxiety disorder, unspecified: Secondary | ICD-10-CM

## 2018-06-21 MED ORDER — ALPRAZOLAM 0.5 MG PO TABS: 0.5000 mg | ORAL_TABLET | Freq: Every evening | ORAL | 0 refills | Status: DC | PRN

## 2018-06-21 MED ORDER — SERTRALINE HCL 25 MG PO TABS: 50.0000 mg | ORAL_TABLET | Freq: Every day | ORAL | 0 refills | Status: DC

## 2018-06-21 NOTE — Progress Notes (Signed)
Subjective:     Patient ID: Nicole Robinson, female   DOB: 1980/08/12, 38 y.o.   MRN: [id number]  This visit type was conducted due to national recommendations for restrictions regarding the COVID-19 Pandemic (e.g. social distancing) in an effort to limit this patient's exposure and mitigate transmission in our community.  Due to their co-morbid illnesses, this patient is at least at moderate risk for complications without adequate follow up.  This format is felt to be most appropriate for this patient at this time.    Documentation for virtual audio and video telecommunications through Zoom encounter:  The patient was located at home. The provider was located in the office. The patient did consent to this visit and is aware of possible charges through their insurance for this visit.  The other persons participating in this telemedicine service were none. Time spent on call was 20 minutes and in review of previous records >25 minutes total.  This virtual service is not related to other E/M service within previous 7 days.   HPI Chief Complaint  Patient presents with  . follow up    follow up meds    Virtual visit today for med check and follow-up on issues with sleep and anxiety.  She MyChart message me recently that she was not doing well with sleep still.  She has a long-term history of sleep problems for years but it is worse in the recent month or 2.  She had been on trazodone in the past and is on this now per my prescription but it still does not help even at the 100 mg dose.  She feels like when she tries to get in the bed her mind is just ruminating over planning in details.  No specific one issue but just constantly thinking about her may be over analyzing day-to-day routine such as food planning housecleaning, kids schedule.  She has 2 children that live with her, her 81-year-old that sleeps in her same bed and her 5 year old son.  They sleep fine.  She feels safe in her home.   She is working full-time on second shift which she has been doing over a year now.  For example she got home at 11:00 last night and has not went to sleep yet and is almost 10:00 AM today.  She has not been on other sleep aids besides over-the-counter melatonin in the past she has never had a sleep study.  Her main problem is she cannot get the sleep to begin with.  She is seeing a therapist twice a week for the last 3 weeks working on her anxiety issues.  She does not seem to settle down in general.  When she tries to get in the bed and cannot go to sleep, she will get back up and do some cleaning or something productive.  She does not watch TV your U screen time right before bedtime.  She used to have a problem with alcohol in the past would drink liquor or wine to the point of passing out daily but has not touched alcohol in quite some time.  She first saw me several months ago about sleep and anxiety after the sudden loss of her brother who died unexpectedly.  She has had other deaths in the family that have been significant in her life including her father overdosed and died when she was only 8 years old, and her mom died when she was age 71.  Her mom died of stroke.  She and  her brother found her mom barely responsive.  Her older brother had to take her off life support about 2 weeks after Amber found her mom unresponsive.  Overall her main concern today is just trying to find a way to get better sleep.  In general she does feel like Zoloft 25 mg has helped some, does not have as much depression symptoms lately.  She continues on gabapentin 300 mg daily for chronic pain which does seem to help with that as well.  Past Medical History:  Diagnosis Date  . ADD (attention deficit disorder)   . Anxiety   . Chlamydia   . Complication of anesthesia    "resistant to anesthesia"  . Cyst of cervix   . Depression   . Heart murmur   . History of alcohol abuse   . Hx of varicella     Review of  Systems As in subjective    Objective:   Physical Exam  Temp 98.1 F (36.7 C) (Oral)   Ht 5\' 3"  (1.6 m)   Wt 227 lb (103 kg)   LMP 05/26/2018 (Exact Date)   BMI 40.21 kg/m   Due to coronavirus pandemic stay at home measures, patient visit was virtual and they were not examined in person.   General: Well-developed, well-nourished, no acute distress, answers questions appropriately     Assessment:     Encounter Diagnoses  Name Primary?  Marland Kitchen Anxiety Yes  . Insomnia, unspecified type   . Chronic bilateral low back pain with sciatica, sciatica laterality unspecified   . Depressed mood   . Sleep disturbance        Plan:      We discussed her symptoms, her prior treatment, anxiety, and overall sleep hygiene.  I strongly suspect that anxiety is still the most likely reason she is having trouble getting to sleep.  I am glad she is seeing the therapist now.  I recommended she get exercise daily, I recommend she start doing some journaling at bedtime to try to write some of her thoughts or planning down so that she can get it off her mind and get to sleep.  We discussed using an app on the smart phone for relaxation and breathing exercises.  I will have her increase her Zoloft to 50 mg daily.  She will double up and take 2 tablets daily as she just got this refilled.  She will call or MyChart message within 2 weeks to let me know how this is doing before we need to send out a refill on the 50 mg.  She will also use short-term Xanax to help as a sleep aid in antianxiety medication short-term nightly the next several days and then as needed.  Discussed risk and benefits of medication.  I recommend she talk to her counselor about her sleep issues and get other advice from the counselor.  For now we will leave gabapentin unchanged at 300 mg daily.  She is already discontinued trazodone as it was not helping.  She will follow-up with my chart message to me within 2 weeks on how she is  doing  Klyn was seen today for follow up.  Diagnoses and all orders for this visit:  Anxiety  Insomnia, unspecified type  Chronic bilateral low back pain with sciatica, sciatica laterality unspecified  Depressed mood  Sleep disturbance  Other orders -     ALPRAZolam (XANAX) 0.5 MG tablet; Take 1 tablet (0.5 mg total) by mouth at bedtime as needed for  anxiety. -     sertraline (ZOLOFT) 25 MG tablet; Take 2 tablets (50 mg total) by mouth daily. TAKE 1 TABLET(25 MG) BY MOUTH DAILY

## 2018-07-11 ENCOUNTER — Other Ambulatory Visit: Payer: Self-pay | Admitting: Medical

## 2018-07-12 NOTE — Telephone Encounter (Signed)
Is this okay to refill? 

## 2018-07-16 ENCOUNTER — Other Ambulatory Visit: Payer: Self-pay | Admitting: Medical

## 2018-07-16 NOTE — Telephone Encounter (Signed)
Is this ok to refill?  

## 2018-09-06 DIAGNOSIS — F411 Generalized anxiety disorder: Secondary | ICD-10-CM | POA: Diagnosis not present

## 2018-09-08 DIAGNOSIS — F411 Generalized anxiety disorder: Secondary | ICD-10-CM | POA: Diagnosis not present

## 2018-09-13 ENCOUNTER — Ambulatory Visit: Payer: BC Managed Care – PPO | Admitting: Medical

## 2018-09-13 ENCOUNTER — Other Ambulatory Visit: Payer: Self-pay

## 2018-09-13 ENCOUNTER — Encounter: Payer: Self-pay | Admitting: Medical

## 2018-09-13 VITALS — Temp 98.9°F | Ht 62.0 in | Wt 220.0 lb

## 2018-09-13 DIAGNOSIS — Z20822 Contact with and (suspected) exposure to covid-19: Secondary | ICD-10-CM

## 2018-09-13 DIAGNOSIS — R0602 Shortness of breath: Secondary | ICD-10-CM | POA: Insufficient documentation

## 2018-09-13 DIAGNOSIS — Z20828 Contact with and (suspected) exposure to other viral communicable diseases: Secondary | ICD-10-CM

## 2018-09-13 DIAGNOSIS — R05 Cough: Secondary | ICD-10-CM | POA: Diagnosis not present

## 2018-09-13 DIAGNOSIS — R059 Cough, unspecified: Secondary | ICD-10-CM | POA: Insufficient documentation

## 2018-09-13 DIAGNOSIS — F411 Generalized anxiety disorder: Secondary | ICD-10-CM | POA: Diagnosis not present

## 2018-09-13 MED ORDER — HYDROCODONE-HOMATROPINE 5-1.5 MG/5ML PO SYRP
5.0000 mL | ORAL_SOLUTION | Freq: Three times a day (TID) | ORAL | 0 refills | Status: AC | PRN
Start: 2018-09-13 — End: 2018-09-18

## 2018-09-13 NOTE — Progress Notes (Signed)
Subjective:     Patient ID: Nicole Robinson, female   DOB: 1980-07-21, 38 y.o.   MRN: [id number]  This visit type was conducted due to national recommendations for restrictions regarding the COVID-19 Pandemic (e.g. social distancing) in an effort to limit this patient's exposure and mitigate transmission in our community.  This format is felt to be most appropriate for this patient at this time.    Documentation for virtual audio and video telecommunications through Zoom encounter:  The patient was located at home. The provider was located in the office. The patient did consent to this visit and is aware of possible charges through their insurance for this visit.  The other persons participating in this telemedicine service were none. Time spent on call was 15 minutes and in review of previous records >15 minutes total.  This virtual service is not related to other E/M service within previous 7 days.   HPI Chief Complaint  Patient presents with  . cough    SOB, cough, sore throat, chills body aches X Yesterday   She notes 2-3 day history of illness.  She reports cough, body aches, sore throat. Feels really fatigued.  No fever though.  When active feels a little SOB, otherwise no SOB.  With mask on feels SOB.   No change in sense of smell or taste.   She notes runny nose.  No ear pain, no joint pain.  Had some headache and diarrhea few days ago but not now.  She was around 2 sick contacts at work.  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  She works at [place] facility.   This resident tested + for Covid about 4 days ago.    She then subsequently found at that 2 other residents have also tested + for covid.  Several coworkers are now sick as well.  She thinks the entire unit and staff and patients have been exposed.  Several residents are positive at this point.   The managers are aware.    She lives at home with her husband  and her 2 children which are 67 years old and 39 years old.  They have all been around her the last few days as well.  None of them are having symptoms.  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  Her kids normally get a daycare but they are staying at home with her for now.  No other aggravating or relieving factors. No other complaint.  Past Medical History:  Diagnosis Date  . ADD (attention deficit disorder)   . Anxiety   . Chlamydia   . Complication of anesthesia    "resistant to anesthesia"  . Cyst of cervix   . Depression   . Heart murmur   . History of alcohol abuse   . Hx of varicella    Current Outpatient Medications on File Prior to Visit  Medication Sig Dispense Refill  . fluticasone (FLONASE) 50 MCG/ACT nasal spray Place 2 sprays into both nostrils daily. 16 g 0  . gabapentin (NEURONTIN) 300 MG capsule TAKE 1 CAPSULE(300 MG) BY MOUTH AT BEDTIME 30 capsule 2  . ALPRAZolam (XANAX) 0.5 MG tablet Take 1 tablet (0.5 mg total) by mouth at bedtime as needed for anxiety. (Patient not taking: Reported on 09/13/2018) 20 tablet 0  . levocetirizine (XYZAL) 5 MG tablet TAKE 1 TABLET(5 MG) BY MOUTH EVERY EVENING (Patient  not taking: Reported on 06/21/2018) 90 tablet 1  . sertraline (ZOLOFT) 25 MG tablet TAKE 1 TABLET(25 MG) BY MOUTH DAILY (Patient not taking: Reported on 09/13/2018) 90 tablet 0   No current facility-administered medications on file prior to visit.      Review of Systems As in subjective    Objective:   Physical Exam Due to coronavirus pandemic stay at home measures, patient visit was virtual and they were not examined in person.   Gen: wd, wd Coughing quite a bit Temp 98.9 F (37.2 C) (Oral)   Ht 5\' 2"  (1.575 m)   Wt 220 lb (99.8 kg)   LMP 09/13/2018 (Exact Date)   BMI 40.24 kg/m        Assessment:     Encounter Diagnoses  Name Primary?  . Exposure to SARS-associated coronavirus Yes  . Cough   . SOB  (shortness of breath)        Plan:     We discussed her symptoms and concerns.  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  1 resident coughed directly in her face.  Apparently one resident is known for being a loving and social person and likes to kiss the caregivers.    We discussed the fact that her symptoms are very likely COVID-19 related.  It sounds like several other coworkers and residents at the nursing home are probably positive as well.  I advise she contact her manager about her symptoms today but we assume she is positive for now.  We will send her for testing.  I advise she talk to her manager about going through when deciding feels need to be tested and he should be sent home.  I advised Amber that she should not work until we have a English as a second language teacher. We discussed the CDC recommendations about how long the self quarantine and how many days to be symptom-free before returning to work.   I advise rest, hydration, begin Hycodan for cough, can use Tylenol for fever aches and chills.  We discussed symptoms that would prompt evaluation at the emergency department.  Call if further questions or worsening symptoms.  We discussed trying to self quarantine in the house away from her other family members.  Unfortunately it may be too late as she is already been around her kids and husband.  We discussed that they need to stay away from other people to since they have already been around her.  so the whole house needs to self quarantine for now.   We discussed the serious nature of this and the potential for contagiousness of this infection.  I advised that she could go get tested at the [facility] testing site but to remain in her car and to wear a mask and that her other family members need to wear a mask anytime they make be around each other but to try to quarantine  away from shows much as possible in the same house.  I answered her questions.   Hanni was seen today for cough.  Diagnoses and all orders for this visit:  Exposure to SARS-associated coronavirus -     Temperature monitoring; Future -     Novel Coronavirus, NAA ([facility])  [place] testing site [place]; Future  Cough -     Temperature monitoring; Future -     Novel Coronavirus, NAA ([facility])  [place] testing site [place]; Future  SOB (shortness of breath) -     Temperature monitoring; Future -     Novel Coronavirus, NAA ([facility])  [place] testing site only; Future  Other orders -     HYDROcodone-homatropine (HYCODAN) 5-1.5 MG/5ML syrup; Take 5 mLs by mouth every 8 (eight) hours as needed for up to 5 days. -     [place]

## 2018-09-14 ENCOUNTER — Emergency Department (HOSPITAL_COMMUNITY): Payer: BC Managed Care – PPO

## 2018-09-14 ENCOUNTER — Telehealth: Payer: Self-pay

## 2018-09-14 ENCOUNTER — Emergency Department (HOSPITAL_COMMUNITY)
Admission: EM | Admit: 2018-09-14 | Discharge: 2018-09-14 | Disposition: A | Discharge status: Home / Self Care | Payer: BC Managed Care – PPO | Attending: Emergency Medicine | Admitting: Emergency Medicine

## 2018-09-14 ENCOUNTER — Encounter (HOSPITAL_COMMUNITY): Payer: Self-pay

## 2018-09-14 ENCOUNTER — Other Ambulatory Visit: Payer: Self-pay

## 2018-09-14 DIAGNOSIS — E860 Dehydration: Secondary | ICD-10-CM | POA: Diagnosis not present

## 2018-09-14 DIAGNOSIS — R509 Fever, unspecified: Secondary | ICD-10-CM

## 2018-09-14 DIAGNOSIS — Z79899 Other long term (current) drug therapy: Secondary | ICD-10-CM | POA: Diagnosis not present

## 2018-09-14 DIAGNOSIS — F17200 Nicotine dependence, unspecified, uncomplicated: Secondary | ICD-10-CM | POA: Diagnosis not present

## 2018-09-14 DIAGNOSIS — R0602 Shortness of breath: Secondary | ICD-10-CM | POA: Diagnosis not present

## 2018-09-14 DIAGNOSIS — U071 COVID-19: Secondary | ICD-10-CM

## 2018-09-14 LAB — CBC WITH DIFFERENTIAL/PLATELET
Abs Immature Granulocytes: 0.03 10*3/uL (ref 0.00–0.07)
Basophils Absolute: 0 10*3/uL (ref 0.0–0.1)
Basophils Relative: 0 %
Eosinophils Absolute: 0 10*3/uL (ref 0.0–0.5)
Eosinophils Relative: 1 %
HCT: 40.4 % (ref 36.0–46.0)
Hemoglobin: 12 g/dL (ref 12.0–15.0)
Immature Granulocytes: 1 %
Lymphocytes Relative: 12 %
Lymphs Abs: 0.7 10*3/uL (ref 0.7–4.0)
MCH: 25.2 pg — ABNORMAL LOW (ref 26.0–34.0)
MCHC: 29.7 g/dL — ABNORMAL LOW (ref 30.0–36.0)
MCV: 84.7 fL (ref 80.0–100.0)
Monocytes Absolute: 0.9 10*3/uL (ref 0.1–1.0)
Monocytes Relative: 15 %
Neutro Abs: 4.3 10*3/uL (ref 1.7–7.7)
Neutrophils Relative %: 71 %
Platelets: 224 10*3/uL (ref 150–400)
RBC: 4.77 MIL/uL (ref 3.87–5.11)
RDW: 14.2 % (ref 11.5–15.5)
WBC: 6 10*3/uL (ref 4.0–10.5)
nRBC: 0 % (ref 0.0–0.2)

## 2018-09-14 LAB — BASIC METABOLIC PANEL
Anion gap: 9 (ref 5–15)
BUN: 8 mg/dL (ref 6–20)
CO2: 26 mmol/L (ref 22–32)
Calcium: 8.7 mg/dL — ABNORMAL LOW (ref 8.9–10.3)
Chloride: 103 mmol/L (ref 98–111)
Creatinine, Ser: 0.8 mg/dL (ref 0.44–1.00)
GFR calc Af Amer: >60 mL/min (ref >60)
GFR calc non Af Amer: >60 mL/min (ref >60)
Glucose, Bld: 93 mg/dL (ref 70–99)
Potassium: 3.7 mmol/L (ref 3.5–5.1)
Sodium: 138 mmol/L (ref 135–145)

## 2018-09-14 LAB — SARS CORONAVIRUS 2 BY RT PCR (HOSPITAL ORDER, PERFORMED IN ~~LOC~~ HOSPITAL LAB): SARS Coronavirus 2: POSITIVE — AB

## 2018-09-14 MED ORDER — IBUPROFEN 800 MG PO TABS
800.0000 mg | ORAL_TABLET | Freq: Once | ORAL | Status: AC
  Administered 2018-09-14: 22:00:00 800 mg via ORAL
  Filled 2018-09-14: qty 1

## 2018-09-14 MED ORDER — SODIUM CHLORIDE 0.9 % IV BOLUS
1000.0000 mL | Freq: Once | INTRAVENOUS | Status: AC
  Administered 2018-09-14: 1000 mL via INTRAVENOUS

## 2018-09-14 NOTE — ED Provider Notes (Signed)
[facility] DEPT Provider Note   CSN: [id number] Arrival date & time: 09/14/18  8416    History   Chief Complaint Chief Complaint  Patient presents with  . Fever  . Shortness of Breath  . Tachycardia  . Cough    HPI Nicole Robinson is a 38 y.o. female.     Pt presents to the ED today with sob and fever.  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  They currently have a covid outbreak.  The pt said her sx started 2 days ago.  She went to [facility] yesterday to be tested, but the tests are backed up so she does not yet have a result.  She said she is feeling more sob, her fever is not breaking, and her heart has been beating fast.  Pt did take 1000 mg of tylenol about 2 hrs ago.     Past Medical History:  Diagnosis Date  . ADD (attention deficit disorder)   . Anxiety   . Chlamydia   . Complication of anesthesia    "resistant to anesthesia"  . Cyst of cervix   . Depression   . Heart murmur   . History of alcohol abuse   . Hx of varicella     Patient Active Problem List   Diagnosis Date Noted  . Exposure to SARS-associated coronavirus 09/13/2018  . Cough 09/13/2018  . SOB (shortness of breath) 09/13/2018  . Insomnia 06/21/2018  . Anxiety 06/21/2018  . Grieving 12/24/2017  . Acute stress reaction 12/24/2017  . Depressed mood 12/24/2017  . Chronic bilateral low back pain with sciatica 11/19/2017  . Carpal tunnel syndrome of right wrist 11/19/2017  . Tendonitis 11/19/2017  . Hair loss 11/19/2017  . Sleep disturbance 11/19/2017  . Need for influenza vaccination 11/19/2017  . Cesarean delivery delivered 07/01/2015    Past Surgical History:  Procedure Laterality Date  . CESAREAN SECTION    . CESAREAN SECTION N/A 06/28/2015   Procedure: CESAREAN SECTION;  Surgeon: Bobbye Charleston, MD;  Location: [place];  Service: Obstetrics;  Laterality: N/A;  . MOLE REMOVAL  06/28/2015   Procedure: MOLE REMOVAL;  Surgeon: Bobbye Charleston, MD;  Location: [place];  Service: Obstetrics;;  offof abdomen  . WISDOM TOOTH EXTRACTION       OB History    Gravida  6   Para  3   Term  2   Preterm  1   AB  3   Living  3     SAB  1   TAB  2   Ectopic      Multiple  0   Live Births  3            Home Medications    Prior to Admission medications   Medication Sig Start Date End Date Taking? Authorizing Provider  acetaminophen (TYLENOL) 500 MG tablet Take 1,000 mg by mouth every 6 (six) hours as needed for fever.   Yes [provider]  fluticasone (FLONASE) 50 MCG/ACT nasal spray Place 2 sprays into both nostrils daily. 05/17/18  Yes Dutch Quint B, FNP  gabapentin (NEURONTIN) 300 MG capsule TAKE 1 CAPSULE(300 MG) BY MOUTH AT BEDTIME 07/12/18  Yes Tysinger, Camelia Eng, PA-C  HYDROcodone-homatropine (HYCODAN) 5-1.5 MG/5ML syrup Take 5 mLs by mouth every 8 (eight) hours as needed for up to 5 days. 09/13/18 09/18/18 Yes Tysinger, Camelia Eng, PA-C  sertraline (ZOLOFT) 25 MG tablet TAKE 1 TABLET(25 MG) BY MOUTH  DAILY 07/16/18  Yes Tysinger, Camelia Eng, PA-C  ALPRAZolam Duanne Moron) 0.5 MG tablet Take 1 tablet (0.5 mg total) by mouth at bedtime as needed for anxiety. Patient not taking: Reported on 09/13/2018 06/21/18   Tysinger, Camelia Eng, PA-C  levocetirizine (XYZAL) 5 MG tablet TAKE 1 TABLET(5 MG) BY MOUTH EVERY EVENING Patient not taking: Reported on 06/21/2018 05/26/18   Tysinger, Camelia Eng, PA-C    Family History Family History  Problem Relation Age of Onset  . Diabetes Maternal Aunt   . Diabetes Maternal Grandmother     Social History Social History   Tobacco Use  . Smoking status: Current Every Day Smoker    Packs/day: 0.25    Last attempt to quit: 11/21/2014    Years since quitting: 3.8  . Smokeless tobacco: Never Used  Substance Use Topics  . Alcohol use: Yes    Alcohol/week: 6.0 standard drinks    Types: 3 Glasses of wine, 3 Standard drinks or equivalent per week    Comment: occasional  .  Drug use: No     Allergies   Other   Review of Systems Review of Systems  Constitutional: Positive for fever.  Respiratory: Positive for shortness of breath.   All other systems reviewed and are negative.    Physical Exam Updated Vital Signs BP 113/80   Pulse 99   Temp (!) 100.7 F (38.2 C) (Oral)   Resp (!) 25   Ht 5\' 2"  (1.575 m)   Wt 99.8 kg   LMP 09/13/2018 (Exact Date)   SpO2 100%   BMI 40.24 kg/m   Physical Exam Vitals signs and nursing note reviewed.  Constitutional:      Appearance: She is well-developed.  HENT:     Head: Normocephalic and atraumatic.     Mouth/Throat:     Mouth: Mucous membranes are moist.  Eyes:     Extraocular Movements: Extraocular movements intact.     Pupils: Pupils are equal, round, and reactive to light.  Neck:     Musculoskeletal: Normal range of motion and neck supple.  Cardiovascular:     Rate and Rhythm: Regular rhythm. Tachycardia present.  Pulmonary:     Effort: Tachypnea present.     Breath sounds: Normal breath sounds.  Abdominal:     General: Bowel sounds are normal.     Palpations: Abdomen is soft.  Musculoskeletal: Normal range of motion.  Skin:    General: Skin is warm and dry.     Capillary Refill: Capillary refill takes less than 2 seconds.  Neurological:     General: No focal deficit present.     Mental Status: She is alert and oriented to person, place, and time.  Psychiatric:        Mood and Affect: Mood normal.        Behavior: Behavior normal.      ED Treatments / Results  Labs (all labs ordered are listed, but only abnormal results are displayed) Labs Reviewed  SARS CORONAVIRUS 2 (HOSPITAL ORDER, [facility]) - Abnormal; Notable for the following components:      Result Value   SARS Coronavirus 2 POSITIVE (*)    All other components within normal limits  BASIC METABOLIC PANEL - Abnormal; Notable for the following components:   Calcium 8.7 (*)    All other  components within normal limits  CBC WITH DIFFERENTIAL/PLATELET - Abnormal; Notable for the following components:   MCH 25.2 (*)    MCHC 29.7 (*)  All other components within normal limits    EKG None  Radiology Dg Chest Portable 1 View  Result Date: 09/14/2018 CLINICAL DATA:  Shortness of breath EXAM: PORTABLE CHEST 1 VIEW COMPARISON:  02/17/2017 FINDINGS: The heart size and mediastinal contours are within normal limits. Both lungs are clear. The visualized skeletal structures are unremarkable. IMPRESSION: No active disease. Electronically Signed   By: Inez Catalina M.D.   On: 09/14/2018 20:29    Procedures Procedures (including critical care time)  Medications Ordered in ED Medications  sodium chloride 0.9 % bolus 1,000 mL (1,000 mLs Intravenous New Bag/Given (Non-Interop) 09/14/18 1946)  ibuprofen (ADVIL) tablet 800 mg (800 mg Oral Given 09/14/18 2131)     Initial Impression / Assessment and Plan / ED Course  I have reviewed the triage vital signs and the nursing notes.  Pertinent labs & imaging results that were available during my care of the patient were reviewed by me and considered in my medical decision making (see chart for details).     Pt is feeling much better after IVFs and fever reduction.  Renal function is ok, so she was also given tylenol.  She knows to return if worse.  She is told to isolate for 10 days after symptom onset.  Return if worse.  Nicole Robinson was evaluated in Emergency Department on 09/14/2018 for the symptoms described in the history of present illness. She was evaluated in the context of the global COVID-19 pandemic, which necessitated consideration that the patient might be at risk for infection with the SARS-CoV-2 virus that causes COVID-19. Institutional protocols and algorithms that pertain to the evaluation of patients at risk for COVID-19 are in a state of rapid change based on information released by regulatory bodies including the CDC  and federal and state organizations. These policies and algorithms were followed during the patient's care in the ED.  Final Clinical Impressions(s) / ED Diagnoses   Final diagnoses:  [id number] virus detected  Fever, unspecified fever cause  Dehydration    ED Discharge Orders    None       Isla Pence, MD 09/14/18 2206

## 2018-09-14 NOTE — ED Triage Notes (Signed)
Patient reports symptoms of fever, SOB, headache generalized achingtachycardia x 2 days. Patient states she works at an [employer] facility and had 2 clients that were Covid +.

## 2018-09-14 NOTE — Telephone Encounter (Signed)
Pt was tested for COVID yesterday. Patient stated her symptoms has worsened. Body ache, stomach pain, headache and some diarrhea last night. Pt stated her daughter is starting to experience the same symptoms and at least 2 of her residents tested positive. Hear rate is 120 and fever is 101. Please advise

## 2018-09-14 NOTE — Telephone Encounter (Signed)
Informed patient that after talking to provider, he recommends that if she is feeling a lot worse with a heart rate of 120 she should be seen at the hospital. Patient was also informed that she needs to call her job and find out if they have a specific protocol for the employees being tested and seen by providers. Patient understood

## 2018-09-14 NOTE — Telephone Encounter (Signed)
We talked already, but she should be seen in the ED if worsening with elevated pulse, worse SOB, fever

## 2018-09-15 DIAGNOSIS — F411 Generalized anxiety disorder: Secondary | ICD-10-CM | POA: Diagnosis not present

## 2018-09-16 LAB — SPECIMEN STATUS REPORT

## 2018-09-16 LAB — NOVEL CORONAVIRUS, NAA: SARS-CoV-2, NAA: DETECTED — AB

## 2018-09-20 DIAGNOSIS — F411 Generalized anxiety disorder: Secondary | ICD-10-CM | POA: Diagnosis not present

## 2018-09-22 DIAGNOSIS — F411 Generalized anxiety disorder: Secondary | ICD-10-CM | POA: Diagnosis not present

## 2018-09-24 DIAGNOSIS — Z1159 Encounter for screening for other viral diseases: Secondary | ICD-10-CM | POA: Diagnosis not present

## 2018-09-27 DIAGNOSIS — F411 Generalized anxiety disorder: Secondary | ICD-10-CM | POA: Diagnosis not present

## 2018-09-29 DIAGNOSIS — F411 Generalized anxiety disorder: Secondary | ICD-10-CM | POA: Diagnosis not present

## 2018-10-04 DIAGNOSIS — F411 Generalized anxiety disorder: Secondary | ICD-10-CM | POA: Diagnosis not present

## 2018-10-05 ENCOUNTER — Other Ambulatory Visit: Payer: Self-pay | Admitting: Medical

## 2018-10-06 DIAGNOSIS — F411 Generalized anxiety disorder: Secondary | ICD-10-CM | POA: Diagnosis not present

## 2018-10-06 NOTE — Telephone Encounter (Signed)
Is this ok to refill?  Shane's patient

## 2018-10-07 DIAGNOSIS — Z20828 Contact with and (suspected) exposure to other viral communicable diseases: Secondary | ICD-10-CM | POA: Diagnosis not present

## 2018-10-07 DIAGNOSIS — Z1159 Encounter for screening for other viral diseases: Secondary | ICD-10-CM | POA: Diagnosis not present

## 2018-10-11 DIAGNOSIS — F411 Generalized anxiety disorder: Secondary | ICD-10-CM | POA: Diagnosis not present

## 2018-10-13 DIAGNOSIS — F411 Generalized anxiety disorder: Secondary | ICD-10-CM | POA: Diagnosis not present

## 2018-10-18 DIAGNOSIS — F411 Generalized anxiety disorder: Secondary | ICD-10-CM | POA: Diagnosis not present

## 2018-12-04 DIAGNOSIS — Z202 Contact with and (suspected) exposure to infections with a predominantly sexual mode of transmission: Secondary | ICD-10-CM | POA: Insufficient documentation

## 2018-12-04 DIAGNOSIS — F17210 Nicotine dependence, cigarettes, uncomplicated: Secondary | ICD-10-CM | POA: Insufficient documentation

## 2018-12-04 DIAGNOSIS — N898 Other specified noninflammatory disorders of vagina: Secondary | ICD-10-CM | POA: Diagnosis not present

## 2018-12-05 ENCOUNTER — Encounter (HOSPITAL_COMMUNITY): Payer: Self-pay | Admitting: *Deleted

## 2018-12-05 ENCOUNTER — Other Ambulatory Visit: Payer: Self-pay

## 2018-12-05 ENCOUNTER — Emergency Department (HOSPITAL_COMMUNITY)
Admission: EM | Admit: 2018-12-05 | Discharge: 2018-12-05 | Disposition: A | Discharge status: Home / Self Care | Payer: BC Managed Care – PPO | Attending: Emergency Medicine | Admitting: Emergency Medicine

## 2018-12-05 DIAGNOSIS — Z202 Contact with and (suspected) exposure to infections with a predominantly sexual mode of transmission: Secondary | ICD-10-CM

## 2018-12-05 LAB — WET PREP, GENITAL
Sperm: NONE SEEN
Trich, Wet Prep: NONE SEEN
Yeast Wet Prep HPF POC: NONE SEEN

## 2018-12-05 MED ORDER — AZITHROMYCIN 250 MG PO TABS
1000.0000 mg | ORAL_TABLET | Freq: Once | ORAL | Status: AC
  Administered 2018-12-05: 03:00:00 1000 mg via ORAL
  Filled 2018-12-05: qty 4

## 2018-12-05 MED ORDER — STERILE WATER FOR INJECTION IJ SOLN
INTRAMUSCULAR | Status: AC
  Administered 2018-12-05: 1.2 mL
  Filled 2018-12-05: qty 10

## 2018-12-05 MED ORDER — CEFTRIAXONE SODIUM 250 MG IJ SOLR
250.0000 mg | Freq: Once | INTRAMUSCULAR | Status: AC
  Administered 2018-12-05: 250 mg via INTRAMUSCULAR
  Filled 2018-12-05: qty 250

## 2018-12-05 NOTE — Discharge Instructions (Signed)
You may find your results on my chart in 2 to 3 days.

## 2018-12-05 NOTE — ED Notes (Signed)
Pelvic setup is bedside.

## 2018-12-05 NOTE — ED Provider Notes (Signed)
[facility] DEPT Provider Note  CSN: [id number] Arrival date & time: 12/04/18 2358  Chief Complaint(s) Exposure to STD  HPI Nicole Robinson is a 38 y.o. female who presents to the emergency department with possible exposure to STD.  She reports that her female partner contacted her today stating that he tested positive for gonorrhea.  Patient endorses mild vaginal discharge but states that this is typical of her premenstrual cycle.  She denies any pelvic discomfort or current vaginal bleeding.  Denies any other physical complaints.  HPI  Past Medical History Past Medical History:  Diagnosis Date  . ADD (attention deficit disorder)   . Anxiety   . Chlamydia   . Complication of anesthesia    "resistant to anesthesia"  . Cyst of cervix   . Depression   . Heart murmur   . History of alcohol abuse   . Hx of varicella    Patient Active Problem List   Diagnosis Date Noted  . Exposure to SARS-associated coronavirus 09/13/2018  . Cough 09/13/2018  . SOB (shortness of breath) 09/13/2018  . Insomnia 06/21/2018  . Anxiety 06/21/2018  . Grieving 12/24/2017  . Acute stress reaction 12/24/2017  . Depressed mood 12/24/2017  . Chronic bilateral low back pain with sciatica 11/19/2017  . Carpal tunnel syndrome of right wrist 11/19/2017  . Tendonitis 11/19/2017  . Hair loss 11/19/2017  . Sleep disturbance 11/19/2017  . Need for influenza vaccination 11/19/2017  . Cesarean delivery delivered 07/01/2015   Home Medication(s) Prior to Admission medications   Medication Sig Start Date End Date Taking? Authorizing Provider  fluticasone (FLONASE) 50 MCG/ACT nasal spray Place 2 sprays into both nostrils daily. 05/17/18  Yes Dutch Quint B, FNP  gabapentin (NEURONTIN) 300 MG capsule TAKE 1 CAPSULE(300 MG) BY MOUTH AT BEDTIME 10/06/18  Yes Denita Lung, MD  levocetirizine (XYZAL) 5 MG tablet TAKE 1 TABLET(5 MG) BY MOUTH EVERY EVENING 05/26/18  Yes Tysinger, Camelia Eng,  PA-C  sertraline (ZOLOFT) 25 MG tablet TAKE 1 TABLET(25 MG) BY MOUTH DAILY 07/16/18  Yes Tysinger, Camelia Eng, PA-C  acetaminophen (TYLENOL) 500 MG tablet Take 1,000 mg by mouth every 6 (six) hours as needed for fever.    [provider]  ALPRAZolam Duanne Moron) 0.5 MG tablet Take 1 tablet (0.5 mg total) by mouth at bedtime as needed for anxiety. Patient not taking: Reported on 09/13/2018 06/21/18   Tysinger, Camelia Eng, PA-C                                                                                                                                    Past Surgical History Past Surgical History:  Procedure Laterality Date  . CESAREAN SECTION    . CESAREAN SECTION N/A 06/28/2015   Procedure: CESAREAN SECTION;  Surgeon: Bobbye Charleston, MD;  Location: [facility];  Service: Obstetrics;  Laterality: N/A;  . MOLE REMOVAL  06/28/2015   Procedure: MOLE  REMOVAL;  Surgeon: Bobbye Charleston, MD;  Location: [place];  Service: Obstetrics;;  offof abdomen  . WISDOM TOOTH EXTRACTION     Family History Family History  Problem Relation Age of Onset  . Diabetes Maternal Aunt   . Diabetes Maternal Grandmother     Social History Social History   Tobacco Use  . Smoking status: Current Every Day Smoker    Packs/day: 0.25    Last attempt to quit: 11/21/2014    Years since quitting: 4.0  . Smokeless tobacco: Never Used  Substance Use Topics  . Alcohol use: Yes    Alcohol/week: 6.0 standard drinks    Types: 3 Glasses of wine, 3 Standard drinks or equivalent per week    Comment: occasional  . Drug use: No   Allergies Other  Review of Systems Review of Systems All other systems are reviewed and are negative for acute change except as noted in the HPI  Physical Exam Vital Signs  I have reviewed the triage vital signs BP (!) 154/88 (BP Location: Left Arm)   Pulse 95   Temp 98.7 F (37.1 C) (Oral)   Resp 16   LMP 11/14/2018   SpO2 100%   Physical Exam Vitals signs reviewed.  Exam conducted with a chaperone present.  Constitutional:      General: She is not in acute distress.    Appearance: She is well-developed. She is not diaphoretic.  HENT:     Head: Normocephalic and atraumatic.     Right Ear: External ear normal.     Left Ear: External ear normal.     Nose: Nose normal.  Eyes:     General: No scleral icterus.    Conjunctiva/sclera: Conjunctivae normal.  Neck:     Musculoskeletal: Normal range of motion.     Trachea: Phonation normal.  Cardiovascular:     Rate and Rhythm: Normal rate and regular rhythm.  Pulmonary:     Effort: Pulmonary effort is normal. No respiratory distress.     Breath sounds: No stridor.  Abdominal:     General: There is no distension.     Tenderness: There is no abdominal tenderness.  Genitourinary:    Vagina: No vaginal discharge, erythema or tenderness.     Cervix: No cervical motion tenderness, discharge, friability, lesion or erythema.     Uterus: Not tender.      Adnexa:        Right: No tenderness.         Left: No tenderness.    Musculoskeletal: Normal range of motion.  Neurological:     Mental Status: She is alert and oriented to person, place, and time.  Psychiatric:        Behavior: Behavior normal.     ED Results and Treatments Labs (all labs ordered are listed, but only abnormal results are displayed) Labs Reviewed  WET PREP, GENITAL - Abnormal; Notable for the following components:      Result Value   Clue Cells Wet Prep HPF POC PRESENT (*)    WBC, Wet Prep HPF POC MANY (*)    All other components within normal limits  GC/CHLAMYDIA PROBE AMP ([place]) NOT AT [facility]  EKG  EKG Interpretation  Date/Time:    Ventricular Rate:    PR Interval:    QRS Duration:   QT Interval:    QTC Calculation:   R Axis:     Text Interpretation:        Radiology No results found.   Pertinent labs & imaging results that were available during my care of the patient were reviewed by me and considered in my medical decision making (see chart for details).  Medications Ordered in ED Medications  cefTRIAXone (ROCEPHIN) injection 250 mg (250 mg Intramuscular Given 12/05/18 0155)  sterile water (preservative free) injection (1.2 mLs  Given 12/05/18 0155)  azithromycin (ZITHROMAX) tablet 1,000 mg (1,000 mg Oral Given 12/05/18 0247)                                                                                                                                    Procedures Procedures  (including critical care time)  Medical Decision Making / ED Course I have reviewed the nursing notes for this encounter and the patient's prior records (if available in EHR or on provided paperwork).   Nicole Robinson was evaluated in Emergency Department on 12/05/2018 for the symptoms described in the history of present illness. She was evaluated in the context of the global COVID-19 pandemic, which necessitated consideration that the patient might be at risk for infection with the SARS-CoV-2 virus that causes COVID-19. Institutional protocols and algorithms that pertain to the evaluation of patients at risk for COVID-19 are in a state of rapid change based on information released by regulatory bodies including the CDC and federal and state organizations. These policies and algorithms were followed during the patient's care in the ED.  Possible gonorrhea exposure Exam without evidence of cervicitis or PID. Treated empirically per her request. Wet prep negative for trichomonas.  Positive clue cells but patient is asymptomatic. GC chlamydia pending.  The patient appears reasonably screened and/or stabilized for discharge and I doubt any other medical condition or other [facility] requiring further screening, evaluation, or treatment in the ED at this time prior to discharge.  The patient is safe for  discharge with strict return precautions.       Final Clinical Impression(s) / ED Diagnoses Final diagnoses:  STD exposure    The patient appears reasonably screened and/or stabilized for discharge and I doubt any other medical condition or other [facility] requiring further screening, evaluation, or treatment in the ED at this time prior to discharge.  Disposition: Discharge  Condition: Good  I have discussed the results, Dx and Tx plan with the patient who expressed understanding and agree(s) with the plan. Discharge instructions discussed at great length. The patient was given strict return precautions who verbalized understanding of the instructions. No further questions at time of discharge.    ED Discharge Orders    None        Follow Up: Carlena Hurl,  PA-C [address] [place] [address] [phone]   As needed      This chart was dictated using voice recognition software.  Despite best efforts to proofread,  errors can occur which can change the documentation meaning.   Fatima Blank, MD 12/05/18 [phone]

## 2018-12-05 NOTE — ED Notes (Signed)
Pelvic setup at bedside.

## 2018-12-05 NOTE — ED Triage Notes (Signed)
Pt reports she received a call that a partner has gonorrhea. Wants tested for the same.

## 2018-12-07 LAB — GC/CHLAMYDIA PROBE AMP (~~LOC~~) NOT AT ARMC
Chlamydia: NEGATIVE
Neisseria Gonorrhea: NEGATIVE

## 2019-01-10 ENCOUNTER — Encounter: Payer: Self-pay | Admitting: Medical

## 2019-01-10 ENCOUNTER — Other Ambulatory Visit: Payer: Self-pay

## 2019-01-10 ENCOUNTER — Ambulatory Visit: Payer: BC Managed Care – PPO | Admitting: Medical

## 2019-01-10 VITALS — BP 120/82 | HR 86 | Temp 97.5°F | Ht 62.0 in | Wt 222.4 lb

## 2019-01-10 DIAGNOSIS — S00511A Abrasion of lip, initial encounter: Secondary | ICD-10-CM

## 2019-01-10 DIAGNOSIS — M25562 Pain in left knee: Secondary | ICD-10-CM

## 2019-01-10 DIAGNOSIS — M25461 Effusion, right knee: Secondary | ICD-10-CM | POA: Insufficient documentation

## 2019-01-10 DIAGNOSIS — M25561 Pain in right knee: Secondary | ICD-10-CM | POA: Insufficient documentation

## 2019-01-10 DIAGNOSIS — M25462 Effusion, left knee: Secondary | ICD-10-CM

## 2019-01-10 DIAGNOSIS — S80211A Abrasion, right knee, initial encounter: Secondary | ICD-10-CM | POA: Insufficient documentation

## 2019-01-10 DIAGNOSIS — Z23 Encounter for immunization: Secondary | ICD-10-CM | POA: Insufficient documentation

## 2019-01-10 DIAGNOSIS — M545 Low back pain, unspecified: Secondary | ICD-10-CM | POA: Insufficient documentation

## 2019-01-10 MED ORDER — IBUPROFEN 800 MG PO TABS: 800.0000 mg | ORAL_TABLET | Freq: Three times a day (TID) | ORAL | 0 refills | Status: DC | PRN

## 2019-01-10 MED ORDER — HYDROCODONE-ACETAMINOPHEN 5-325 MG PO TABS: 1.0000 | ORAL_TABLET | Freq: Four times a day (QID) | ORAL | 0 refills | Status: DC | PRN
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
## 2019-01-12 ENCOUNTER — Other Ambulatory Visit: Payer: Self-pay

## 2019-01-12 ENCOUNTER — Ambulatory Visit
Admission: RE | Admit: 2019-01-12 | Discharge: 2019-01-12 | Disposition: A | Discharge status: Home / Self Care | Payer: BC Managed Care – PPO | Source: Ambulatory Visit | Attending: Medical | Admitting: Medical

## 2019-01-12 DIAGNOSIS — M7989 Other specified soft tissue disorders: Secondary | ICD-10-CM | POA: Diagnosis not present

## 2019-01-12 DIAGNOSIS — M25562 Pain in left knee: Secondary | ICD-10-CM | POA: Diagnosis not present

## 2019-01-12 DIAGNOSIS — M25561 Pain in right knee: Secondary | ICD-10-CM | POA: Diagnosis not present

## 2019-01-12 DIAGNOSIS — M25462 Effusion, left knee: Secondary | ICD-10-CM

## 2019-01-12 DIAGNOSIS — S8992XA Unspecified injury of left lower leg, initial encounter: Secondary | ICD-10-CM | POA: Diagnosis not present

## 2019-01-12 DIAGNOSIS — M545 Low back pain, unspecified: Secondary | ICD-10-CM

## 2019-01-12 DIAGNOSIS — S199XXA Unspecified injury of neck, initial encounter: Secondary | ICD-10-CM | POA: Diagnosis not present

## 2019-01-12 DIAGNOSIS — M25461 Effusion, right knee: Secondary | ICD-10-CM

## 2019-01-13 ENCOUNTER — Telehealth: Payer: Self-pay | Admitting: Medical

## 2019-01-13 NOTE — Telephone Encounter (Signed)
Pt called and states that her employer is requesting a return to work note. They are requesting if any restrictions are need and if so what. Please cal pt at [phone].

## 2019-01-14 NOTE — Telephone Encounter (Signed)
Make her f/u appt Monday to re-check/re-examine

## 2019-01-17 ENCOUNTER — Other Ambulatory Visit: Payer: Self-pay

## 2019-01-17 ENCOUNTER — Ambulatory Visit: Payer: BC Managed Care – PPO | Admitting: Medical

## 2019-01-17 ENCOUNTER — Encounter: Payer: Self-pay | Admitting: Medical

## 2019-01-17 VITALS — BP 134/82 | HR 72 | Temp 97.5°F | Ht 62.0 in | Wt 229.6 lb

## 2019-01-17 DIAGNOSIS — M545 Low back pain, unspecified: Secondary | ICD-10-CM

## 2019-01-17 DIAGNOSIS — M25461 Effusion, right knee: Secondary | ICD-10-CM | POA: Diagnosis not present

## 2019-01-17 DIAGNOSIS — M25462 Effusion, left knee: Secondary | ICD-10-CM

## 2019-01-17 DIAGNOSIS — M25561 Pain in right knee: Secondary | ICD-10-CM

## 2019-01-17 DIAGNOSIS — M25562 Pain in left knee: Secondary | ICD-10-CM

## 2019-01-17 DIAGNOSIS — S80211A Abrasion, right knee, initial encounter: Secondary | ICD-10-CM | POA: Diagnosis not present

## 2019-01-17 MED ORDER — IBUPROFEN 800 MG PO TABS: 800.0000 mg | ORAL_TABLET | Freq: Three times a day (TID) | ORAL | 0 refills | Status: DC | PRN

## 2019-01-17 NOTE — Patient Instructions (Signed)
Gradually resume your activity  I wrote work restrictions today  Recheck here on 01/31/2019 for evaluation with possible release for full duty at that time  Continue ibuprofen up to 3 times daily as needed  Stretch your legs daily

## 2019-01-17 NOTE — Telephone Encounter (Signed)
Per Juliann Pulse pt. Has been scheuled
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
## 2019-01-31 ENCOUNTER — Ambulatory Visit: Payer: BC Managed Care – PPO | Admitting: Medical

## 2019-02-02 DIAGNOSIS — Z1159 Encounter for screening for other viral diseases: Secondary | ICD-10-CM | POA: Diagnosis not present

## 2019-02-02 DIAGNOSIS — Z20828 Contact with and (suspected) exposure to other viral communicable diseases: Secondary | ICD-10-CM | POA: Diagnosis not present

## 2019-02-03 ENCOUNTER — Encounter: Payer: Self-pay | Admitting: Medical

## 2019-02-23 ENCOUNTER — Other Ambulatory Visit: Payer: Self-pay | Admitting: Medical

## 2019-03-12 DIAGNOSIS — Z20828 Contact with and (suspected) exposure to other viral communicable diseases: Secondary | ICD-10-CM | POA: Diagnosis not present

## 2019-03-12 DIAGNOSIS — Z1159 Encounter for screening for other viral diseases: Secondary | ICD-10-CM | POA: Diagnosis not present

## 2019-03-21 DIAGNOSIS — Z1159 Encounter for screening for other viral diseases: Secondary | ICD-10-CM | POA: Diagnosis not present

## 2019-03-21 DIAGNOSIS — Z20828 Contact with and (suspected) exposure to other viral communicable diseases: Secondary | ICD-10-CM | POA: Diagnosis not present

## 2019-03-22 ENCOUNTER — Other Ambulatory Visit: Payer: Self-pay | Admitting: Family Medicine

## 2019-03-22 NOTE — Telephone Encounter (Signed)
Is this okay to refill? 

## 2019-04-12 ENCOUNTER — Encounter: Payer: Self-pay | Admitting: Medical

## 2019-04-12 ENCOUNTER — Ambulatory Visit (INDEPENDENT_AMBULATORY_CARE_PROVIDER_SITE_OTHER): Payer: BC Managed Care – PPO | Admitting: Medical

## 2019-04-12 ENCOUNTER — Other Ambulatory Visit: Payer: Self-pay

## 2019-04-12 VITALS — Temp 97.6°F | Ht 62.0 in | Wt 222.0 lb

## 2019-04-12 DIAGNOSIS — F988 Other specified behavioral and emotional disorders with onset usually occurring in childhood and adolescence: Secondary | ICD-10-CM | POA: Diagnosis not present

## 2019-04-12 DIAGNOSIS — R4689 Other symptoms and signs involving appearance and behavior: Secondary | ICD-10-CM

## 2019-04-12 DIAGNOSIS — Z559 Problems related to education and literacy, unspecified: Secondary | ICD-10-CM

## 2019-04-12 MED ORDER — AMPHETAMINE-DEXTROAMPHETAMINE 15 MG PO TABS: 15.0000 mg | ORAL_TABLET | Freq: Two times a day (BID) | ORAL | 0 refills | Status: DC

## 2019-04-12 NOTE — Progress Notes (Signed)
This visit type was conducted due to national recommendations for restrictions regarding the COVID-19 Pandemic (e.g. social distancing) in an effort to limit this patient's exposure and mitigate transmission in our community.  Due to their co-morbid illnesses, this patient is at least at moderate risk for complications without adequate follow up.  This format is felt to be most appropriate for this patient at this time.    Documentation for virtual audio and video telecommunications through Zoom encounter:  The patient was located at home. The provider was located in the office. The patient did consent to this visit and is aware of possible charges through their insurance for this visit.  The other persons participating in this telemedicine service were none. Time spent on call was 20 minutes and in review of previous records >20 minutes total.  This virtual service is not related to other E/M service within previous 7 days.  Subjective: Chief Complaint  Patient presents with  . Consult    attention issues    Here for concerns about attention.    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  Having some issues with concentration, focus and attention, particularly in lecture.  Was curious about taking medication to help with attention and focus.  Is getting distracted in class. The lack of focus is affecting her grades. Been in class 4 weeks now.  Has tried study groups.   Is also busy with working full time, taking care of her children as well.  This is a year program, started in early January.  Is forgetting things, forgetting assignments.   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    Last time she was in school was 06-08-10.  Was on some medicaiton then to help with focus, but was also having some marital issues at the time, seeing counseling then as well.  Was also taking care of brother with health issues then too.  Had diagnosis of ADD and depression at that  time as an adult.  Not diagnosed as a child.  Her son has IEP and he has some similar diagnoses.  At that time was on Adderall, trazodone, ativan to help with mood, attention, and insomnia issues.  No other prior medications for focus and attention.   She notes that she had struggles in middle school and thereon.  Particularly did worse in certain subjects likely reading and social studies, but did fine in math.   Would lose focus in areas she didn't have as much interest in.   Ended up having to get GED, and didn't do well with initial attempts at college, would start and not finish.    Would lose financial aid given difficulties she had completing courses and having to drop out.     Was on Zoloft this past year, but not taking currently.   Was dealing with a lot of stress in past 2 years given death of brother in 06-07-17.   Past Medical History:  Diagnosis Date  . ADD (attention deficit disorder)   . Anxiety   . Chlamydia   . Complication of anesthesia    "resistant to anesthesia"  . Cyst of cervix   . Depression   . Heart murmur   . History of alcohol abuse   . Hx of varicella    Current Outpatient Medications on File Prior to Visit  Medication Sig Dispense Refill  . acetaminophen (TYLENOL) 500 MG tablet Take 1,000 mg by mouth every 6 (six) hours as needed  for fever.    . gabapentin (NEURONTIN) 300 MG capsule TAKE 1 CAPSULE(300 MG) BY MOUTH AT BEDTIME 90 capsule 1  . sertraline (ZOLOFT) 25 MG tablet TAKE 1 TABLET(25 MG) BY MOUTH DAILY 90 tablet 0  . ALPRAZolam (XANAX) 0.5 MG tablet Take 1 tablet (0.5 mg total) by mouth at bedtime as needed for anxiety. (Patient not taking: Reported on 09/13/2018) 20 tablet 0  . fluticasone (FLONASE) 50 MCG/ACT nasal spray Place 2 sprays into both nostrils daily. (Patient not taking: Reported on 01/10/2019) 16 g 0  . ibuprofen (ADVIL) 800 MG tablet TAKE 1 TABLET(800 MG) BY MOUTH EVERY 8 HOURS AS NEEDED (Patient not taking: Reported on 04/12/2019) 30 tablet 0   . levocetirizine (XYZAL) 5 MG tablet TAKE 1 TABLET(5 MG) BY MOUTH EVERY EVENING (Patient not taking: Reported on 01/10/2019) 90 tablet 1   No current facility-administered medications on file prior to visit.   ROS as in subjective     Objective: Temp 97.6 F (36.4 C)   Ht 5\' 2"  (1.575 m)   Wt 222 lb (100.7 kg)   BMI 40.60 kg/m   Not examined in person as this was a virtual consult    Assessment: Encounter Diagnoses  Name Primary?  . Attention deficit disorder, unspecified hyperactivity presence Yes  . Behavior concern   . School problem      Plan: Discussed symptoms, concerns, prior diagnoses.  She has done well on this medication prior.  She was diagnosed as an adult back in 2012.  Normally functions okay without medication but she is back in school on an accelerated nursing program year-long program currently.  Discussed the medication, risk and benefits of medication, proper use of medication, keeping medicine under safekeeping since this is a controlled substance.  Discussed possible drug screen.  Discussed possible side effects to watch out for.  We discussed having a good sleep routine, try to get exercise regularly, decrease stress were possible, work to balance work and school schedules.    Teiana was seen today for consult.  Diagnoses and all orders for this visit:  Attention deficit disorder, unspecified hyperactivity presence  Behavior concern  School problem  Other orders -     amphetamine-dextroamphetamine (ADDERALL) 15 MG tablet; Take 1 tablet by mouth 2 (two) times daily.  Spent > 30 minutes face to face with patient in discussion of symptoms, evaluation, plan and recommendations.   F/u 42mo in person

## 2019-05-09 ENCOUNTER — Other Ambulatory Visit: Payer: Self-pay

## 2019-05-09 ENCOUNTER — Other Ambulatory Visit: Payer: Self-pay | Admitting: Medical

## 2019-05-09 ENCOUNTER — Telehealth: Payer: Self-pay | Admitting: Medical

## 2019-05-09 MED ORDER — AMPHETAMINE-DEXTROAMPHETAMINE 15 MG PO TABS: 15.0000 mg | ORAL_TABLET | Freq: Two times a day (BID) | ORAL | 0 refills | Status: DC

## 2019-05-09 NOTE — Telephone Encounter (Signed)
Pt called and made a CPE appt for April. Pt will need Adderall refilled. Please send to [facility] on [address]. Pt can be reached at [phone].

## 2019-05-11 NOTE — Telephone Encounter (Signed)
Can we work her in sooner, as last visit with new medication I advised  1 month followup

## 2019-05-11 NOTE — Telephone Encounter (Signed)
Checking with patient to see if she can come in sooner.

## 2019-05-11 NOTE — Telephone Encounter (Signed)
Patient has an appointment scheduled for April

## 2019-05-11 NOTE — Telephone Encounter (Signed)
Due for in person f/u on attention deficit, new medication

## 2019-05-12 ENCOUNTER — Other Ambulatory Visit: Payer: Self-pay | Admitting: Medical

## 2019-05-16 NOTE — Telephone Encounter (Signed)
The 4/14 was the soonest appointment available for a physical.

## 2019-05-26 DIAGNOSIS — F325 Major depressive disorder, single episode, in full remission: Secondary | ICD-10-CM

## 2019-05-26 HISTORY — DX: Major depressive disorder, single episode, in full remission: F32.5

## 2019-06-08 ENCOUNTER — Encounter: Payer: Self-pay | Admitting: Medical

## 2019-06-08 ENCOUNTER — Other Ambulatory Visit: Payer: Self-pay

## 2019-06-08 ENCOUNTER — Ambulatory Visit: Payer: BC Managed Care – PPO | Admitting: Medical

## 2019-06-08 VITALS — BP 112/70 | HR 79 | Temp 98.2°F | Ht 62.0 in | Wt 227.0 lb

## 2019-06-08 DIAGNOSIS — M5441 Lumbago with sciatica, right side: Secondary | ICD-10-CM

## 2019-06-08 DIAGNOSIS — Z139 Encounter for screening, unspecified: Secondary | ICD-10-CM | POA: Diagnosis not present

## 2019-06-08 DIAGNOSIS — F988 Other specified behavioral and emotional disorders with onset usually occurring in childhood and adolescence: Secondary | ICD-10-CM

## 2019-06-08 DIAGNOSIS — M5440 Lumbago with sciatica, unspecified side: Secondary | ICD-10-CM | POA: Diagnosis not present

## 2019-06-08 DIAGNOSIS — G5601 Carpal tunnel syndrome, right upper limb: Secondary | ICD-10-CM | POA: Diagnosis not present

## 2019-06-08 DIAGNOSIS — Z131 Encounter for screening for diabetes mellitus: Secondary | ICD-10-CM | POA: Diagnosis not present

## 2019-06-08 DIAGNOSIS — Z0000 Encounter for general adult medical examination without abnormal findings: Secondary | ICD-10-CM | POA: Diagnosis not present

## 2019-06-08 DIAGNOSIS — F325 Major depressive disorder, single episode, in full remission: Secondary | ICD-10-CM | POA: Diagnosis not present

## 2019-06-08 DIAGNOSIS — F419 Anxiety disorder, unspecified: Secondary | ICD-10-CM

## 2019-06-08 DIAGNOSIS — G8929 Other chronic pain: Secondary | ICD-10-CM

## 2019-06-08 DIAGNOSIS — L659 Nonscarring hair loss, unspecified: Secondary | ICD-10-CM

## 2019-06-08 DIAGNOSIS — M779 Enthesopathy, unspecified: Secondary | ICD-10-CM

## 2019-06-08 DIAGNOSIS — G479 Sleep disorder, unspecified: Secondary | ICD-10-CM

## 2019-06-08 DIAGNOSIS — Z13220 Encounter for screening for lipoid disorders: Secondary | ICD-10-CM

## 2019-06-08 MED ORDER — AMPHETAMINE-DEXTROAMPHETAMINE 20 MG PO TABS: 20.0000 mg | ORAL_TABLET | Freq: Two times a day (BID) | ORAL | 0 refills | Status: DC

## 2019-06-08 NOTE — Progress Notes (Signed)
Subjective:   HPI  Nicole Robinson is a 39 y.o. female who presents for Chief Complaint  Patient presents with  . Annual Exam    without fasting labs     Patient Care Team: Abhay Godbolt, Camelia Eng, PA-C as PCP - General (Family Medicine) Sees dentist Sees eye doctor Gynecology  Concerns: ADD -she does not like to 50 mg is working all that well.  She cannot seem to stay quite focused on this dose.  She has been on higher dose of this in the past.  She has a form for completion to international program including vaccination records.  She did not have any vaccine records with her   Reviewed their medical, surgical, family, social, medication, and allergy history and updated chart as appropriate.  Past Medical History:  Diagnosis Date  . ADD (attention deficit disorder)   . Anxiety   . Chlamydia   . Complication of anesthesia    "resistant to anesthesia"  . Cyst of cervix   . Depression   . Depression, major, in remission ([facility]) 05/2019  . Heart murmur   . History of alcohol abuse   . Hx of varicella     Past Surgical History:  Procedure Laterality Date  . CESAREAN SECTION    . CESAREAN SECTION N/A 06/28/2015   Procedure: CESAREAN SECTION;  Surgeon: Bobbye Charleston, MD;  Location: [facility];  Service: Obstetrics;  Laterality: N/A;  . MOLE REMOVAL  06/28/2015   Procedure: MOLE REMOVAL;  Surgeon: Bobbye Charleston, MD;  Location: [facility];  Service: Obstetrics;;  offof abdomen  . WISDOM TOOTH EXTRACTION      Social History   Socioeconomic History  . Marital status: Married    Spouse name: Not on file  . Number of children: Not on file  . Years of education: Not on file  . Highest education level: Not on file  Occupational History  . Not on file  Tobacco Use  . Smoking status: Former Smoker    Packs/day: 0.25    Quit date: 11/21/2014    Years since quitting: 4.5  . Smokeless tobacco: Never Used  . Tobacco comment: vape   Substance and Sexual  Activity  . Alcohol use: Yes    Alcohol/week: 3.0 standard drinks    Types: 3 Shots of liquor per week    Comment: occasional  . Drug use: No  . Sexual activity: Yes  Other Topics Concern  . Not on file  Social History Narrative   Lives with 2 of her children.   Working at [employer], [place] program.   Exercise with walking 05/2019.     Social Determinants of Health   Financial Resource Strain:   . Difficulty of Paying Living Expenses:   Food Insecurity:   . Worried About Charity fundraiser in the Last Year:   . Arboriculturist in the Last Year:   Transportation Needs:   . Film/video editor (Medical):   Marland Kitchen Lack of Transportation (Non-Medical):   Physical Activity:   . Days of Exercise per Week:   . Minutes of Exercise per Session:   Stress:   . Feeling of Stress :   Social Connections:   . Frequency of Communication with Friends and Family:   . Frequency of Social Gatherings with Friends and Family:   . Attends Religious Services:   . Active Member of Clubs or Organizations:   . Attends Archivist Meetings:   Marland Kitchen Marital Status:  Intimate Partner Violence:   . Fear of Current or Ex-Partner:   . Emotionally Abused:   Marland Kitchen Physically Abused:   . Sexually Abused:     Family History  Problem Relation Age of Onset  . Diabetes Maternal Aunt   . Diabetes Maternal Grandmother   . Alcohol abuse Mother   . Stroke Mother   . Drug abuse Father        died of overdose  . Diabetes Maternal Uncle   . Cancer Paternal Aunt        breast  . Other Brother        MVA and overdose  . Other Brother        hypothermia? accidental  . Heart disease Neg Hx      Current Outpatient Medications:  .  amphetamine-dextroamphetamine (ADDERALL) 15 MG tablet, Take 1 tablet by mouth 2 (two) times daily., Disp: 60 tablet, Rfl: 0 .  gabapentin (NEURONTIN) 300 MG capsule, TAKE 1 CAPSULE(300 MG) BY MOUTH AT BEDTIME, Disp: 90 capsule, Rfl: 1 .  sertraline (ZOLOFT) 25 MG  tablet, TAKE 1 TABLET(25 MG) BY MOUTH DAILY (Patient not taking: Reported on 06/08/2019), Disp: 90 tablet, Rfl: 0 .  acetaminophen (TYLENOL) 500 MG tablet, Take 1,000 mg by mouth every 6 (six) hours as needed for fever., Disp: , Rfl:  .  ALPRAZolam (XANAX) 0.5 MG tablet, Take 1 tablet (0.5 mg total) by mouth at bedtime as needed for anxiety. (Patient not taking: Reported on 09/13/2018), Disp: 20 tablet, Rfl: 0 .  amphetamine-dextroamphetamine (ADDERALL) 20 MG tablet, Take 1 tablet (20 mg total) by mouth 2 (two) times daily., Disp: 60 tablet, Rfl: 0 .  fluticasone (FLONASE) 50 MCG/ACT nasal spray, Place 2 sprays into both nostrils daily. (Patient not taking: Reported on 01/10/2019), Disp: 16 g, Rfl: 0 .  ibuprofen (ADVIL) 800 MG tablet, TAKE 1 TABLET(800 MG) BY MOUTH EVERY 8 HOURS AS NEEDED (Patient not taking: Reported on 04/12/2019), Disp: 30 tablet, Rfl: 0 .  levocetirizine (XYZAL) 5 MG tablet, TAKE 1 TABLET(5 MG) BY MOUTH EVERY EVENING (Patient not taking: Reported on 01/10/2019), Disp: 90 tablet, Rfl: 1  Allergies  Allergen Reactions  . Other Hives    [place]     Review of Systems Constitutional: -fever, -chills, -sweats, -unexpected weight change, -decreased appetite, -fatigue Allergy: -sneezing, -itching, -congestion Dermatology: -changing moles, --rash, -lumps ENT: -runny nose, -ear pain, -sore throat, -hoarseness, -sinus pain, -teeth pain, - ringing in ears, -hearing loss, -nosebleeds Cardiology: -chest pain, -palpitations, -swelling, -difficulty breathing when lying flat, -waking up short of breath Respiratory: -cough, -shortness of breath, -difficulty breathing with exercise or exertion, -wheezing, -coughing up blood Gastroenterology: -abdominal pain, -nausea, -vomiting, -diarrhea, -constipation, -blood in stool, -changes in bowel movement, -difficulty swallowing or eating Hematology: -bleeding, -bruising  Musculoskeletal: -joint aches, -muscle aches, -joint swelling, -back pain,  -neck pain, -cramping, -changes in gait Ophthalmology: denies vision changes, eye redness, itching, discharge Urology: -burning with urination, -difficulty urinating, -blood in urine, -urinary frequency, -urgency, -incontinence Neurology: -headache, -weakness, -tingling, -numbness, -memory loss, -falls, -dizziness Psychology: -depressed mood, -agitation, -sleep problems Breast/gyn: -breast tendnerss, -discharge, -lumps, -vaginal discharge,- irregular periods, -heavy periods     Objective:  BP 112/70   Pulse 79   Temp 98.2 F (36.8 C)   Ht '5\' 2"'  (1.575 m)   Wt 227 lb (103 kg)   SpO2 98%   BMI 41.52 kg/m   General appearance: alert, no distress, WD/WN, African American female Skin: tattoo low back, otherwise skin unremarkable, hair with  hair extension in place? Neck: supple, no lymphadenopathy, no thyromegaly, no masses, normal ROM, no bruits Chest: non tender, normal shape and expansion Heart: RRR, normal S1, S2, no murmurs Lungs: CTA bilaterally, no wheezes, rhonchi, or rales Abdomen: +bs, soft, non tender, non distended, no masses, no hepatomegaly, no splenomegaly, no bruits Back: non tender, normal ROM, no scoliosis Musculoskeletal: upper extremities non tender, no obvious deformity, normal ROM throughout, lower extremities non tender, no obvious deformity, normal ROM throughout Extremities: no edema, no cyanosis, no clubbing Pulses: 2+ symmetric, upper and lower extremities, normal cap refill Neurological: alert, oriented x 3, CN2-12 intact, strength normal upper extremities and lower extremities, sensation normal throughout, DTRs 2+ throughout, no cerebellar signs, gait normal Psychiatric: normal affect, behavior normal, pleasant  Breast/gyn/rectal - deferred to gynecology     Assessment and Plan :   Encounter Diagnoses  Name Primary?  . Encounter for health maintenance examination in adult Yes  . Depression, major, in remission ([facility])   . Carpal tunnel syndrome of right  wrist   . Chronic bilateral low back pain with sciatica, sciatica laterality unspecified   . Tendonitis   . Anxiety   . Attention deficit disorder, unspecified hyperactivity presence   . Sleep disturbance   . Hair loss   . Screening for condition   . Screening for diabetes mellitus   . Screening for lipid disorders     Physical exam - discussed and counseled on healthy lifestyle, diet, exercise, preventative care, vaccinations, sick and well care, proper use of emergency dept and after hours care, and addressed their concerns.    Health screening: Advised they see their eye doctor yearly for routine vision care. Advised they see their dentist yearly for routine dental care including hygiene visits twice yearly. See your gynecologist yearly for routine gynecological care.   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  Discussed risk and benefits of medication.  Discussed organizational skills, school preparation  Elevated BMI-discussed need to lose weight through healthy diet and exercise  History of tendinitis and carpal tunnel symptoms, low back pain-chronic but no major concern of this today  Ikram was seen today for annual exam.  Diagnoses and all orders for this visit:  Encounter for health maintenance examination in adult -     Comprehensive metabolic panel -     CBC -     Lipid panel -     Hemoglobin A1c -     Measles/Mumps/Rubella Immunity -     Hepatitis B surface antibody,quantitative -     Varicella zoster antibody, IgG -      QuantiFERON-TB Gold Plus  Depression, major, in remission ([facility])  Carpal tunnel syndrome of right wrist  Chronic bilateral low back pain with sciatica, sciatica laterality unspecified  Tendonitis  Anxiety  Attention deficit disorder, unspecified hyperactivity presence  Sleep disturbance  Hair loss  Screening for condition -     Measles/Mumps/Rubella Immunity -     Hepatitis B surface antibody,quantitative -     Varicella zoster antibody, IgG -     QuantiFERON-TB Gold Plus  Screening for diabetes mellitus -  Hemoglobin A1c  Screening for lipid disorders -     Lipid panel  Other orders -     amphetamine-dextroamphetamine (ADDERALL) 20 MG tablet; Take 1 tablet (20 mg total) by mouth 2 (two) times daily.    Follow-up pending labs, yearly for physical

## 2019-06-09 NOTE — Progress Notes (Signed)
Forwarding to u

## 2019-06-11 LAB — CBC
Hematocrit: 36.4 % (ref 34.0–46.6)
Hemoglobin: 11.2 g/dL (ref 11.1–15.9)
MCH: 24.1 pg — ABNORMAL LOW (ref 26.6–33.0)
MCHC: 30.8 g/dL — ABNORMAL LOW (ref 31.5–35.7)
MCV: 78 fL — ABNORMAL LOW (ref 79–97)
Platelets: 274 10*3/uL (ref 150–450)
RBC: 4.65 x10E6/uL (ref 3.77–5.28)
RDW: 14.4 % (ref 11.7–15.4)
WBC: 11.1 10*3/uL — ABNORMAL HIGH (ref 3.4–10.8)

## 2019-06-11 LAB — COMPREHENSIVE METABOLIC PANEL
ALT: 10 IU/L (ref 0–32)
AST: 13 IU/L (ref 0–40)
Albumin/Globulin Ratio: 1.4 (ref 1.2–2.2)
Albumin: 4.3 g/dL (ref 3.8–4.8)
Alkaline Phosphatase: 86 IU/L (ref 39–117)
BUN/Creatinine Ratio: 15 (ref 9–23)
BUN: 12 mg/dL (ref 6–24)
Bilirubin Total: 0.3 mg/dL (ref 0.0–1.2)
CO2: 24 mmol/L (ref 20–29)
Calcium: 9.3 mg/dL (ref 8.7–10.2)
Chloride: 105 mmol/L (ref 96–106)
Creatinine, Ser: 0.78 mg/dL (ref 0.57–1.00)
GFR calc Af Amer: 109 mL/min/{1.73_m2} (ref >59)
GFR calc non Af Amer: 95 mL/min/{1.73_m2} (ref >59)
Globulin, Total: 3 g/dL (ref 1.5–4.5)
Glucose: 93 mg/dL (ref 65–99)
Potassium: 4 mmol/L (ref 3.5–5.2)
Sodium: 140 mmol/L (ref 134–144)
Total Protein: 7.3 g/dL (ref 6.0–8.5)

## 2019-06-11 LAB — QUANTIFERON-TB GOLD PLUS
QuantiFERON Mitogen Value: >10 IU/mL
QuantiFERON Nil Value: 0 IU/mL
QuantiFERON TB1 Ag Value: 0 IU/mL
QuantiFERON TB2 Ag Value: 0 IU/mL
QuantiFERON-TB Gold Plus: NEGATIVE

## 2019-06-11 LAB — LIPID PANEL
Chol/HDL Ratio: 2.7 ratio (ref 0.0–4.4)
Cholesterol, Total: 179 mg/dL (ref 100–199)
HDL: 67 mg/dL (ref >39)
LDL Chol Calc (NIH): 98 mg/dL (ref 0–99)
Triglycerides: 73 mg/dL (ref 0–149)
VLDL Cholesterol Cal: 14 mg/dL (ref 5–40)

## 2019-06-11 LAB — VARICELLA ZOSTER ANTIBODY, IGG: Varicella zoster IgG: 1175 index (ref >165)

## 2019-06-11 LAB — MEASLES/MUMPS/RUBELLA IMMUNITY
MUMPS ABS, IGG: 208 AU/mL (ref >10.9)
RUBEOLA AB, IGG: 133 AU/mL (ref >16.4)
Rubella Antibodies, IGG: 1.88 index (ref >0.99)

## 2019-06-11 LAB — HEPATITIS B SURFACE ANTIBODY, QUANTITATIVE: Hepatitis B Surf Ab Quant: <3.1 m[IU]/mL — ABNORMAL LOW (ref >9.9)

## 2019-06-11 LAB — HEMOGLOBIN A1C
Est. average glucose Bld gHb Est-mCnc: 97 mg/dL
Hgb A1c MFr Bld: 5 % (ref 4.8–5.6)

## 2019-07-03 ENCOUNTER — Ambulatory Visit (HOSPITAL_COMMUNITY)
Admission: EM | Admit: 2019-07-03 | Discharge: 2019-07-03 | Disposition: A | Discharge status: Home / Self Care | Payer: BC Managed Care – PPO | Attending: Physician Assistant | Admitting: Physician Assistant

## 2019-07-03 ENCOUNTER — Encounter (HOSPITAL_COMMUNITY): Payer: Self-pay

## 2019-07-03 ENCOUNTER — Other Ambulatory Visit: Payer: Self-pay

## 2019-07-03 DIAGNOSIS — M62830 Muscle spasm of back: Secondary | ICD-10-CM | POA: Diagnosis not present

## 2019-07-03 DIAGNOSIS — M546 Pain in thoracic spine: Secondary | ICD-10-CM | POA: Diagnosis not present

## 2019-07-03 MED ORDER — IBUPROFEN 600 MG PO TABS: 600.0000 mg | ORAL_TABLET | Freq: Four times a day (QID) | ORAL | 0 refills | Status: DC | PRN

## 2019-07-03 MED ORDER — TIZANIDINE HCL 4 MG PO TABS: 4.0000 mg | ORAL_TABLET | Freq: Four times a day (QID) | ORAL | 0 refills | Status: DC | PRN

## 2019-07-03 NOTE — Discharge Instructions (Signed)
I believe you are having muscle spasms in your back -Take the Zanaflex up to every 6 hours, this will make you sleepy so do not drive or drink alcohol after taking this -Take the ibuprofen every 6 hours for 2 to 3 days and then as needed  If significantly worsening pain, pain seems to radiate through your chest at all times you have shortness of breath or significant change in the type of pain went to be evaluated in the emergency department.   You may also follow-up with your primary care if not improving over the next week

## 2019-07-03 NOTE — ED Triage Notes (Signed)
Pt c/o 8/10 dull pain in middle of back that started yesterday. Pt denies injury. Pt denies numbness and tingling. Pt able to move all extremities. Pt was able to walk to exam room. Pt tried OTC meds w/o relief.

## 2019-07-03 NOTE — ED Provider Notes (Signed)
[facility]    CSN: [id number] Arrival date & time: 07/03/19  1719      History   Chief Complaint Chief Complaint  Patient presents with  . Back Pain    HPI Nicole Robinson is a 39 y.o. female.   Patient presents for evaluation of 1 day history of mid back pain.  She reports yesterday she was standing at [employer]	[place] she had shooting pain in her back.  She reports since then she has had up to 8/10 aching back pain on both sides of her back.  She reports it significantly worse with movement.  She reports twisting and bending makes it worse.  Reports bending cytocide also makes it worse.  She reports that she is seated and relaxing it does not bother her much however if she has to get up it hurts significantly.  She has also had what she feels is been a little to shortness of breath with movement since having the back pain.  She describes this is a taking short of breath due to the pain when walking.  She denies shortness of breath at rest.  Denies chest pain.  She denies injury to the back or previous injury to the back.  She has had previous muscle spasms in her low back.  She was concerned as this was lasting longer.  No history of high blood pressure or heart issues.     Past Medical History:  Diagnosis Date  . ADD (attention deficit disorder)   . Anxiety   . Chlamydia   . Complication of anesthesia    "resistant to anesthesia"  . Cyst of cervix   . Depression   . Depression, major, in remission ([facility]) 05/2019  . Heart murmur   . History of alcohol abuse   . Hx of varicella     Patient Active Problem List   Diagnosis Date Noted  . Screening for lipid disorders 06/08/2019  . Depression, major, in remission ([facility]) 06/08/2019  . Attention deficit disorder 04/12/2019  . Anxiety 06/21/2018  . Chronic bilateral low back pain with sciatica 11/19/2017  . Carpal tunnel syndrome of right wrist 11/19/2017  . Tendonitis 11/19/2017  . Hair loss 11/19/2017  . Sleep  disturbance 11/19/2017    Past Surgical History:  Procedure Laterality Date  . CESAREAN SECTION    . CESAREAN SECTION N/A 06/28/2015   Procedure: CESAREAN SECTION;  Surgeon: Bobbye Charleston, MD;  Location: [facility];  Service: Obstetrics;  Laterality: N/A;  . MOLE REMOVAL  06/28/2015   Procedure: MOLE REMOVAL;  Surgeon: Bobbye Charleston, MD;  Location: [facility];  Service: Obstetrics;;  offof abdomen  . WISDOM TOOTH EXTRACTION      OB History    Gravida  6   Para  3   Term  2   Preterm  1   AB  3   Living  3     SAB  1   TAB  2   Ectopic      Multiple  0   Live Births  3            Home Medications    Prior to Admission medications   Medication Sig Start Date End Date Taking? Authorizing Provider  sertraline (ZOLOFT) 25 MG tablet TAKE 1 TABLET(25 MG) BY MOUTH DAILY Patient not taking: Reported on 06/08/2019 05/12/19   Tysinger, Camelia Eng, PA-C  acetaminophen (TYLENOL) 500 MG tablet Take 1,000 mg by mouth every 6 (six) hours as needed  for fever.    [provider]  ALPRAZolam Duanne Moron) 0.5 MG tablet Take 1 tablet (0.5 mg total) by mouth at bedtime as needed for anxiety. Patient not taking: Reported on 09/13/2018 06/21/18   Tysinger, Camelia Eng, PA-C  amphetamine-dextroamphetamine (ADDERALL) 15 MG tablet Take 1 tablet by mouth 2 (two) times daily. 05/09/19   Tysinger, Camelia Eng, PA-C  amphetamine-dextroamphetamine (ADDERALL) 20 MG tablet Take 1 tablet (20 mg total) by mouth 2 (two) times daily. 06/08/19   Tysinger, Camelia Eng, PA-C  fluticasone (FLONASE) 50 MCG/ACT nasal spray Place 2 sprays into both nostrils daily. Patient not taking: Reported on 01/10/2019 05/17/18   Dutch Quint B, FNP  gabapentin (NEURONTIN) 300 MG capsule TAKE 1 CAPSULE(300 MG) BY MOUTH AT BEDTIME 03/22/19   Tysinger, Camelia Eng, PA-C  ibuprofen (ADVIL) 600 MG tablet Take 1 tablet (600 mg total) by mouth every 6 (six) hours as needed. 07/03/19   Hobie Kohles, Marguerita Beards, PA-C  levocetirizine (XYZAL)  5 MG tablet TAKE 1 TABLET(5 MG) BY MOUTH EVERY EVENING Patient not taking: Reported on 01/10/2019 05/26/18   Tysinger, Camelia Eng, PA-C  tiZANidine (ZANAFLEX) 4 MG tablet Take 1 tablet (4 mg total) by mouth every 6 (six) hours as needed for muscle spasms. 07/03/19   Khara Renaud, Marguerita Beards, PA-C    Family History Family History  Problem Relation Age of Onset  . Diabetes Maternal Aunt   . Diabetes Maternal Grandmother   . Alcohol abuse Mother   . Stroke Mother   . Drug abuse Father        died of overdose  . Diabetes Maternal Uncle   . Cancer Paternal Aunt        breast  . Other Brother        MVA and overdose  . Other Brother        hypothermia? accidental  . Heart disease Neg Hx     Social History Social History   Tobacco Use  . Smoking status: Former Smoker    Packs/day: 0.25    Quit date: 11/21/2014    Years since quitting: 4.6  . Smokeless tobacco: Never Used  . Tobacco comment: vape   Substance Use Topics  . Alcohol use: Yes    Alcohol/week: 3.0 standard drinks    Types: 3 Shots of liquor per week    Comment: occasional  . Drug use: No     Allergies   Other and Voltaren [diclofenac]   Review of Systems Review of Systems   Physical Exam Triage Vital Signs ED Triage Vitals [07/03/19 1735]  Enc Vitals Group     BP 137/75     Pulse Rate 92     Resp 18     Temp 98.4 F (36.9 C)     Temp Source Oral     SpO2 100 %     Weight 224 lb (101.6 kg)     Height 5\' 3"  (1.6 m)     Head Circumference      Peak Flow      Pain Score 8     Pain Loc      Pain Edu?      Excl. in [place]?    No data found.  Updated Vital Signs BP 137/75   Pulse 92   Temp 98.4 F (36.9 C) (Oral)   Resp 18   Ht 5\' 3"  (1.6 m)   Wt 224 lb (101.6 kg)   SpO2 100%   BMI 39.68 kg/m   Visual Acuity  Right Eye Distance:   Left Eye Distance:   Bilateral Distance:    Right Eye Near:   Left Eye Near:    Bilateral Near:     Physical Exam Vitals and nursing note reviewed.  Constitutional:       General: She is not in acute distress.    Appearance: She is well-developed. She is not ill-appearing.  HENT:     Head: Normocephalic and atraumatic.  Eyes:     Conjunctiva/sclera: Conjunctivae normal.  Cardiovascular:     Rate and Rhythm: Normal rate and regular rhythm.     Heart sounds: No murmur.     Comments: Radial pulses equal 2+ bilaterally Pulmonary:     Effort: Pulmonary effort is normal. No respiratory distress.     Breath sounds: Normal breath sounds.  Abdominal:     Palpations: Abdomen is soft.     Tenderness: There is no abdominal tenderness.  Musculoskeletal:     Cervical back: Neck supple.     Comments: Significant tenderness to palpation of the upper thoracic back musculature bilaterally.  There is mild spasm appreciable in places.  Patient has elicited pain with movement of the upper extremities, with flexion and extension of the back.  There is no rash or deformity.  No midline tenderness.  Skin:    General: Skin is warm and dry.  Neurological:     Mental Status: She is alert.      UC Treatments / Results  Labs (all labs ordered are listed, but only abnormal results are displayed) Labs Reviewed - No data to display  EKG   Radiology No results found.  Procedures Procedures (including critical care time)  Medications Ordered in UC Medications - No data to display  Initial Impression / Assessment and Plan / UC Course  I have reviewed the triage vital signs and the nursing notes.  Pertinent labs & imaging results that were available during my care of the patient were reviewed by me and considered in my medical decision making (see chart for details).     #Muscle spasm #Acute bilateral thoracic back pain Patient is a 39 year old with no major medical history presenting with symptoms consistent with muscle spasm and thoracic back pain.  Given reproducibility and exam consistent with muscular pain doubt intrathoracic etiologies with regard to cardiac,  vascular or pulmonary.  Will start on Zanaflex and ibuprofen with strict follow-up in emergency department precautions.  Patient verbalized understanding. Final Clinical Impressions(s) / UC Diagnoses   Final diagnoses:  Acute bilateral thoracic back pain  Muscle spasm of back     Discharge Instructions     I believe you are having muscle spasms in your back -Take the Zanaflex up to every 6 hours, this will make you sleepy so do not drive or drink alcohol after taking this -Take the ibuprofen every 6 hours for 2 to 3 days and then as needed  If significantly worsening pain, pain seems to radiate through your chest at all times you have shortness of breath or significant change in the type of pain went to be evaluated in the emergency department.   You may also follow-up with your primary care if not improving over the next week      ED Prescriptions    Medication Sig Dispense Auth. Provider   tiZANidine (ZANAFLEX) 4 MG tablet Take 1 tablet (4 mg total) by mouth every 6 (six) hours as needed for muscle spasms. 30 tablet Tasha Diaz, Marguerita Beards, PA-C   ibuprofen (ADVIL)  600 MG tablet Take 1 tablet (600 mg total) by mouth every 6 (six) hours as needed. 30 tablet Macaria Bias, Marguerita Beards, PA-C     PDMP not reviewed this encounter.   Purnell Shoemaker, PA-C 07/03/19 1843

## 2019-07-08 ENCOUNTER — Telehealth: Payer: Self-pay | Admitting: Medical

## 2019-07-08 MED ORDER — AMPHETAMINE-DEXTROAMPHETAMINE 15 MG PO TABS: 15.0000 mg | ORAL_TABLET | Freq: Two times a day (BID) | ORAL | 0 refills | Status: DC

## 2019-07-08 NOTE — Telephone Encounter (Signed)
Pt need refill on Adderrall 20mg  sent to the [facility] on [address] and [address]

## 2019-07-11 ENCOUNTER — Other Ambulatory Visit: Payer: Self-pay | Admitting: Medical

## 2019-07-11 MED ORDER — AMPHETAMINE-DEXTROAMPHETAMINE 20 MG PO TABS: 20.0000 mg | ORAL_TABLET | Freq: Two times a day (BID) | ORAL | 0 refills | Status: DC

## 2019-07-14 ENCOUNTER — Encounter: Payer: Self-pay | Admitting: Medical

## 2019-07-14 ENCOUNTER — Other Ambulatory Visit: Payer: Self-pay

## 2019-07-14 ENCOUNTER — Ambulatory Visit: Payer: BC Managed Care – PPO | Admitting: Medical

## 2019-07-14 VITALS — BP 136/92 | HR 96 | Ht 62.0 in | Wt 219.0 lb

## 2019-07-14 DIAGNOSIS — M5440 Lumbago with sciatica, unspecified side: Secondary | ICD-10-CM

## 2019-07-14 DIAGNOSIS — Z23 Encounter for immunization: Secondary | ICD-10-CM | POA: Insufficient documentation

## 2019-07-14 DIAGNOSIS — F988 Other specified behavioral and emotional disorders with onset usually occurring in childhood and adolescence: Secondary | ICD-10-CM | POA: Diagnosis not present

## 2019-07-14 DIAGNOSIS — R350 Frequency of micturition: Secondary | ICD-10-CM | POA: Diagnosis not present

## 2019-07-14 DIAGNOSIS — N3941 Urge incontinence: Secondary | ICD-10-CM

## 2019-07-14 DIAGNOSIS — G8929 Other chronic pain: Secondary | ICD-10-CM

## 2019-07-14 MED ORDER — GABAPENTIN 100 MG PO CAPS: 100.0000 mg | ORAL_CAPSULE | Freq: Every day | ORAL | 2 refills | Status: DC

## 2019-07-14 NOTE — Patient Instructions (Addendum)
Things to try to see if this works better...  One option is to try the Adderall 20mg , 1.5 tablets (30mg ) at 8am for the next week  Another option is to try Adderall 20mg , 1/2 tablet at 7am, and a second 1/2 tablet at 10 or 10:30am for morning dosing and 20mg  whole tablet at 1pm.   Return in 1 month for Hepsilav B vaccine

## 2019-07-14 NOTE — Progress Notes (Signed)
Subjective:  Nicole Robinson is a 39 y.o. female who presents for Chief Complaint  Patient presents with  . Follow-up    medication management      Here for follow-up. Neck and her physical visit in April we increased the Adderall to 20 mg twice daily. She can definitely feel that the medicine is helping for the first 3 hours after taking the pill but then it seems to wear off fairly quickly. But she does notice to be there is versus not being on the medication. She is certainly getting things done. She also notes that the morning hours is a very stressful time.  She is a single mom. 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 Her classes on Monday Wednesday Friday 8-4:30, Tuesday/ Thursday 8 AM to 3:30 PM. 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 So her limit the sleep is from 5 in the afternoon till 10 PM at night  From 7-8 it is a fast-paced time at home getting kids together right when she gets home from work. She has a limited 20-minute window to get a child ready get out the door and take him to daycare. She is penalized if she is not in class ready to go 8 AM  She was curious about Exar version of Adderall as an option  Here to follow-up on vaccines and school form from last visit. She was not immune to hep B so needs a booster.  She has chronic back pain. With her busy schedule she is not able to exercise much. She uses gabapentin but because of the sedation she uses it sparingly. It does help.  No other aggravating or relieving factors.    No other c/o.  Past Medical History:  Diagnosis Date  . ADD (attention deficit disorder)   . Anxiety   . Chlamydia   . Complication of anesthesia    "resistant to anesthesia"  . Cyst of cervix   . Depression   . Depression, major, in remission ([place]) 05/2019  . Heart murmur   . History of alcohol abuse   . Hx of varicella    Current Outpatient Medications on File Prior to Visit  Medication Sig Dispense Refill   . amphetamine-dextroamphetamine (ADDERALL) 20 MG tablet Take 1 tablet (20 mg total) by mouth 2 (two) times daily. 60 tablet 0  . ibuprofen (ADVIL) 600 MG tablet Take 1 tablet (600 mg total) by mouth every 6 (six) hours as needed. 30 tablet 0  . tiZANidine (ZANAFLEX) 4 MG tablet Take 1 tablet (4 mg total) by mouth every 6 (six) hours as needed for muscle spasms. 30 tablet 0  . acetaminophen (TYLENOL) 500 MG tablet Take 1,000 mg by mouth every 6 (six) hours as needed for fever.     No current facility-administered medications on file prior to visit.     The following portions of the patient's history were reviewed and updated as appropriate: allergies, current medications, past family history, past medical history, past social history, past surgical history and problem list.  ROS Otherwise as in subjective above  Objective: BP (!) 136/92   Pulse 96   Ht 5\' 2"  (1.575 m)   Wt 219 lb (99.3 kg)   SpO2 98%   BMI 40.06 kg/m   General appearance: alert, no distress, well developed, well nourished [facility], seems overstressed otherwise answers questions appropriately    Assessment: Encounter Diagnoses  Name Primary?  . Attention deficit disorder,  unspecified hyperactivity presence Yes  . Frequent urination   . Need for hepatitis B vaccination   . Chronic bilateral low back pain with sciatica, sciatica laterality unspecified   . Urge incontinence      Plan: Attention deficit-we discussed different options on how to take her Adderall. She will try some of the strategies we discussed today and let me know in 3 to 4 weeks what is working better for her  Patient Instructions  Things to try to see if this works better...  One option is to try the Adderall 20mg , 1.5 tablets (30mg ) at 8am for the next week  Another option is to try Adderall 20mg , 1/2 tablet at 7am, and a second 1/2 tablet at 10 or 10:30am for morning dosing and 20mg  whole tablet at 1pm.   Return in 1 month for  Hepsilav B vaccine      Chronic back pain-change to lower dose gabapentin that she could take daily. Discussed need for stretching and exercise  Frequent urination-she will monitor how often she urinates. We discussed limiting caffeine, continue to drink plenty of water throughout the day the. Discussed Kegel exercises    Nicole Robinson was seen today for follow-up.  Diagnoses and all orders for this visit:  Attention deficit disorder, unspecified hyperactivity presence  Frequent urination  Need for hepatitis B vaccination -     Heplisav-B (HepB-CPG) Vaccine  Chronic bilateral low back pain with sciatica, sciatica laterality unspecified  Urge incontinence  Other orders -     gabapentin (NEURONTIN) 100 MG capsule; Take 1 capsule (100 mg total) by mouth at bedtime.    Follow up: 3 wk

## 2019-07-25 DIAGNOSIS — Z20828 Contact with and (suspected) exposure to other viral communicable diseases: Secondary | ICD-10-CM | POA: Diagnosis not present

## 2019-07-25 DIAGNOSIS — Z1159 Encounter for screening for other viral diseases: Secondary | ICD-10-CM | POA: Diagnosis not present

## 2019-07-29 ENCOUNTER — Other Ambulatory Visit: Payer: Self-pay | Admitting: Medical

## 2019-07-29 MED ORDER — AMPHETAMINE-DEXTROAMPHETAMINE 30 MG PO TABS: 30.0000 mg | ORAL_TABLET | Freq: Two times a day (BID) | ORAL | 0 refills | Status: DC

## 2019-08-25 ENCOUNTER — Other Ambulatory Visit: Payer: Self-pay

## 2019-08-25 ENCOUNTER — Encounter: Payer: Self-pay | Admitting: Medical

## 2019-08-25 ENCOUNTER — Ambulatory Visit: Payer: BC Managed Care – PPO | Admitting: Medical

## 2019-08-25 VITALS — BP 124/82 | HR 60 | Temp 98.3°F | Wt 214.4 lb

## 2019-08-25 DIAGNOSIS — F988 Other specified behavioral and emotional disorders with onset usually occurring in childhood and adolescence: Secondary | ICD-10-CM | POA: Diagnosis not present

## 2019-08-25 DIAGNOSIS — H9202 Otalgia, left ear: Secondary | ICD-10-CM | POA: Diagnosis not present

## 2019-08-25 DIAGNOSIS — Z23 Encounter for immunization: Secondary | ICD-10-CM

## 2019-08-25 DIAGNOSIS — M26609 Unspecified temporomandibular joint disorder, unspecified side: Secondary | ICD-10-CM | POA: Diagnosis not present

## 2019-08-25 DIAGNOSIS — F419 Anxiety disorder, unspecified: Secondary | ICD-10-CM

## 2019-08-25 MED ORDER — AMPHETAMINE-DEXTROAMPHET ER 30 MG PO CP24
30.0000 mg | ORAL_CAPSULE | ORAL | 0 refills | Status: DC
Start: 2019-08-25 — End: 2019-09-12

## 2019-08-25 NOTE — Progress Notes (Signed)
Subjective:  Nicole Robinson is a 39 y.o. female who presents for Chief Complaint  Patient presents with  . other    acute visit for ear pain lt. ear hit ear drum with q tip 1 week ago     Here for acute concern is left ear pain.  She was using a Q-tip the other day and thinks she may have went to deep.  No blood no drainage.  She does have tenderness to the left face.  She has been chewing a lot of gum and this has been a stress reliever of sorts during the day.  Has not seen her dentist in over a year  Here to have her second hepatitis B Heplisav B vaccine  She continues on Adderall.  Last visit we changed to 30 mg twice daily regular release.  She feels like this is working okay but would like to try the XR as she is in school long hours and has clinicals and labs.  This is working better than the prior 20 mg dose.  No specific adverse effects  No other aggravating or relieving factors.    No other c/o.  Past Medical History:  Diagnosis Date  . ADD (attention deficit disorder)   . Anxiety   . Chlamydia   . Complication of anesthesia    "resistant to anesthesia"  . Cyst of cervix   . Depression   . Depression, major, in remission ([place]) 05/2019  . Heart murmur   . History of alcohol abuse   . Hx of varicella    Current Outpatient Medications on File Prior to Visit  Medication Sig Dispense Refill  . acetaminophen (TYLENOL) 500 MG tablet Take 1,000 mg by mouth every 6 (six) hours as needed for fever.    . gabapentin (NEURONTIN) 100 MG capsule Take 1 capsule (100 mg total) by mouth at bedtime. 30 capsule 2  . ibuprofen (ADVIL) 600 MG tablet Take 1 tablet (600 mg total) by mouth every 6 (six) hours as needed. 30 tablet 0  . tiZANidine (ZANAFLEX) 4 MG tablet Take 1 tablet (4 mg total) by mouth every 6 (six) hours as needed for muscle spasms. 30 tablet 0   No current facility-administered medications on file prior to visit.     The following portions of the patient's history  were reviewed and updated as appropriate: allergies, current medications, past family history, past medical history, past social history, past surgical history and problem list.  ROS Otherwise as in subjective above  Objective: BP 124/82   Pulse 60   Temp 98.3 F (36.8 C)   Wt 214 lb 6.4 oz (97.3 kg)   LMP  (LMP Unknown)   BMI 39.21 kg/m   General appearance: alert, no distress, well developed, well nourished [facility], seems overstressed otherwise answers questions appropriately Left TM and ear canal and external ear normal-appearing, nontender, there is tenderness at the left TMJ though, rest of face nontender, no obvious tooth decay or other oral lesions    Assessment: Encounter Diagnoses  Name Primary?  . Left ear pain Yes  . TMJ (temporomandibular joint syndrome)   . Need for hepatitis B vaccination   . Attention deficit disorder, unspecified hyperactivity presence   . Anxiety      Plan: Attention deficit- change to XR version as she is doing ok on 30mg  but needs it to last thorughou the day as she has class and clinicals.   Call report 58mo  TMJ -cut out the gum chewing  or cut way back on this.  She can use cool compresses and oral ibuprofen for the next several days to reduce the inflammation in the TMJ.  Avoid heavy chewing or hard to chew food items for the next several days.  Counseled on the Hepatitis B virus vaccine.  Vaccine information sheet given.  Heplisav B #2 given after consent.    Georgenia was seen today for other.  Diagnoses and all orders for this visit:  Left ear pain  TMJ (temporomandibular joint syndrome)  Need for hepatitis B vaccination -     Cancel: Hepatitis B vaccine adolescent 2 dose IM -     Heplisav-B (HepB-CPG) Vaccine  Attention deficit disorder, unspecified hyperactivity presence  Anxiety  Other orders -     amphetamine-dextroamphetamine (ADDERALL XR) 30 MG 24 hr capsule; Take 1 capsule (30 mg total) by mouth every  morning.    Follow YH:TMBP report 54mo

## 2019-09-12 ENCOUNTER — Other Ambulatory Visit: Payer: Self-pay | Admitting: Medical

## 2019-09-12 MED ORDER — METHYLPHENIDATE HCL 10 MG PO TABS
10.0000 mg | ORAL_TABLET | Freq: Two times a day (BID) | ORAL | 0 refills | Status: DC
Start: 2019-09-12 — End: 2019-09-29

## 2019-09-29 ENCOUNTER — Other Ambulatory Visit: Payer: Self-pay | Admitting: Medical

## 2019-09-29 MED ORDER — METHYLPHENIDATE HCL 10 MG PO TABS: 10.0000 mg | ORAL_TABLET | Freq: Two times a day (BID) | ORAL | 0 refills | Status: DC

## 2019-10-20 DIAGNOSIS — F325 Major depressive disorder, single episode, in full remission: Secondary | ICD-10-CM | POA: Diagnosis not present

## 2019-10-20 DIAGNOSIS — F902 Attention-deficit hyperactivity disorder, combined type: Secondary | ICD-10-CM | POA: Diagnosis not present

## 2019-10-20 DIAGNOSIS — F419 Anxiety disorder, unspecified: Secondary | ICD-10-CM | POA: Diagnosis not present

## 2019-10-24 ENCOUNTER — Encounter: Payer: Self-pay | Admitting: Medical

## 2019-10-24 ENCOUNTER — Ambulatory Visit: Payer: BC Managed Care – PPO | Admitting: Medical

## 2019-10-24 ENCOUNTER — Other Ambulatory Visit: Payer: Self-pay

## 2019-10-24 VITALS — BP 130/80 | HR 94 | Ht 62.0 in | Wt 221.4 lb

## 2019-10-24 DIAGNOSIS — Z79899 Other long term (current) drug therapy: Secondary | ICD-10-CM | POA: Diagnosis not present

## 2019-10-24 DIAGNOSIS — Z136 Encounter for screening for cardiovascular disorders: Secondary | ICD-10-CM | POA: Diagnosis not present

## 2019-10-24 DIAGNOSIS — F909 Attention-deficit hyperactivity disorder, unspecified type: Secondary | ICD-10-CM

## 2019-10-24 DIAGNOSIS — R011 Cardiac murmur, unspecified: Secondary | ICD-10-CM | POA: Diagnosis not present

## 2019-10-24 DIAGNOSIS — F418 Other specified anxiety disorders: Secondary | ICD-10-CM

## 2019-10-24 NOTE — Progress Notes (Signed)
Subjective:  Nicole Robinson is a 39 y.o. female who presents for Chief Complaint  Patient presents with   Abnormal ECG    psychiatrist      Here for EKG.  She recently established with a psychiatrist who wanted her to come in for EKG before continuing on stimulant medication.  She has tried some stimulant medication in recent months without any side effects.  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  She has trouble at test taking and staying on task and getting stuff completed.  She denies chest pain, palpitations, shortness of breath, edema.  She has history of heart murmur in the past.  No other aggravating or relieving factors.    No other c/o.  The following portions of the patient's history were reviewed and updated as appropriate: allergies, current medications, past family history, past medical history, past social history, past surgical history and problem list.  ROS Otherwise as in subjective above  Objective: BP 130/80    Pulse 94    Ht 5\' 2"  (1.575 m)    Wt 221 lb 6.4 oz (100.4 kg)    SpO2 98%    BMI 40.49 kg/m   General appearance: alert, no distress, well developed, well nourished Neck: supple, no lymphadenopathy, no thyromegaly, no masses, no bruit Heart: Possibly very faint 1-2 out of 6 murmur heard in left upper sternal border, otherwise RRR, normal S1, S2 Lungs: CTA bilaterally, no wheezes, rhonchi, or rales Pulses: 2+ radial pulses, 2+ pedal pulses, normal cap refill Ext: no edema   Assessment: Encounter Diagnoses  Name Primary?   Attention deficit hyperactivity disorder (ADHD), unspecified ADHD type Yes   Screening for heart disease    Test anxiety    High risk medication use    Heart murmur     EKG  indication high risk medication use, rate 71 bpm, PR 162 ms, QRS 84 ms, QTC 421 ms, axis 49 degrees, normal sinus rhythm.  No acute  change    Plan: We discussed concerns, risk of medications.  She does have a very faint murmur.  EKG with questionable findings.  I will touch base with cardiology and contact them to see if she needs further evaluation   Nicole Robinson was seen today for abnormal ecg.  Diagnoses and all orders for this visit:  Attention deficit hyperactivity disorder (ADHD), unspecified ADHD type  Screening for heart disease  Test anxiety  High risk medication use  Heart murmur    Follow up: Pending callback

## 2019-11-07 DIAGNOSIS — Z20828 Contact with and (suspected) exposure to other viral communicable diseases: Secondary | ICD-10-CM | POA: Diagnosis not present

## 2019-11-07 DIAGNOSIS — Z1159 Encounter for screening for other viral diseases: Secondary | ICD-10-CM | POA: Diagnosis not present

## 2019-11-08 DIAGNOSIS — F902 Attention-deficit hyperactivity disorder, combined type: Secondary | ICD-10-CM | POA: Diagnosis not present

## 2019-11-08 DIAGNOSIS — F419 Anxiety disorder, unspecified: Secondary | ICD-10-CM | POA: Diagnosis not present

## 2019-11-08 DIAGNOSIS — F325 Major depressive disorder, single episode, in full remission: Secondary | ICD-10-CM | POA: Diagnosis not present

## 2019-11-16 DIAGNOSIS — F325 Major depressive disorder, single episode, in full remission: Secondary | ICD-10-CM | POA: Diagnosis not present

## 2019-11-16 DIAGNOSIS — F902 Attention-deficit hyperactivity disorder, combined type: Secondary | ICD-10-CM | POA: Diagnosis not present

## 2019-11-16 DIAGNOSIS — F419 Anxiety disorder, unspecified: Secondary | ICD-10-CM | POA: Diagnosis not present

## 2019-11-24 DIAGNOSIS — F419 Anxiety disorder, unspecified: Secondary | ICD-10-CM | POA: Diagnosis not present

## 2019-11-24 DIAGNOSIS — F902 Attention-deficit hyperactivity disorder, combined type: Secondary | ICD-10-CM | POA: Diagnosis not present

## 2019-11-24 DIAGNOSIS — F325 Major depressive disorder, single episode, in full remission: Secondary | ICD-10-CM | POA: Diagnosis not present

## 2019-12-08 DIAGNOSIS — F325 Major depressive disorder, single episode, in full remission: Secondary | ICD-10-CM | POA: Diagnosis not present

## 2019-12-08 DIAGNOSIS — F419 Anxiety disorder, unspecified: Secondary | ICD-10-CM | POA: Diagnosis not present

## 2019-12-08 DIAGNOSIS — F902 Attention-deficit hyperactivity disorder, combined type: Secondary | ICD-10-CM | POA: Diagnosis not present
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
## 2020-01-05 DIAGNOSIS — F325 Major depressive disorder, single episode, in full remission: Secondary | ICD-10-CM | POA: Diagnosis not present

## 2020-01-05 DIAGNOSIS — F419 Anxiety disorder, unspecified: Secondary | ICD-10-CM | POA: Diagnosis not present

## 2020-01-05 DIAGNOSIS — F902 Attention-deficit hyperactivity disorder, combined type: Secondary | ICD-10-CM | POA: Diagnosis not present

## 2020-02-02 DIAGNOSIS — F419 Anxiety disorder, unspecified: Secondary | ICD-10-CM | POA: Diagnosis not present

## 2020-02-02 DIAGNOSIS — F902 Attention-deficit hyperactivity disorder, combined type: Secondary | ICD-10-CM | POA: Diagnosis not present

## 2020-02-02 DIAGNOSIS — F325 Major depressive disorder, single episode, in full remission: Secondary | ICD-10-CM | POA: Diagnosis not present

## 2020-02-29 ENCOUNTER — Other Ambulatory Visit: Payer: Self-pay

## 2020-02-29 ENCOUNTER — Other Ambulatory Visit (INDEPENDENT_AMBULATORY_CARE_PROVIDER_SITE_OTHER): Payer: BC Managed Care – PPO

## 2020-02-29 ENCOUNTER — Encounter: Payer: Self-pay | Admitting: Medical

## 2020-02-29 ENCOUNTER — Telehealth (INDEPENDENT_AMBULATORY_CARE_PROVIDER_SITE_OTHER): Payer: BC Managed Care – PPO | Admitting: Medical

## 2020-02-29 DIAGNOSIS — R059 Cough, unspecified: Secondary | ICD-10-CM

## 2020-02-29 DIAGNOSIS — Z20822 Contact with and (suspected) exposure to covid-19: Secondary | ICD-10-CM

## 2020-02-29 LAB — POC COVID19 BINAXNOW: SARS Coronavirus 2 Ag: NEGATIVE

## 2020-02-29 MED ORDER — HYDROCOD POLST-CPM POLST ER 10-8 MG/5ML PO SUER: 5.0000 mL | Freq: Two times a day (BID) | ORAL | 0 refills | Status: DC

## 2020-02-29 NOTE — Progress Notes (Signed)
Letter has been sent

## 2020-02-29 NOTE — Progress Notes (Signed)
Subjective:     Patient ID: Nicole Robinson, female   DOB: November 10, 1980, 40 y.o.   MRN: [id number]  This visit type was conducted due to national recommendations for restrictions regarding the COVID-19 Pandemic (e.g. social distancing) in an effort to limit this patient's exposure and mitigate transmission in our community.  Due to their co-morbid illnesses, this patient is at least at moderate risk for complications without adequate follow up.  This format is felt to be most appropriate for this patient at this time.    Documentation for virtual audio and video telecommunications through [place] encounter:  The patient was located at home. The provider was located in the office. The patient did consent to this visit and is aware of possible charges through their insurance for this visit.  The other persons participating in this telemedicine service were none. Time spent on call was 20 minutes and in review of previous records 20 minutes total.  This virtual service is not related to other E/M service within previous 7 days.   HPI Chief Complaint  Patient presents with  . Covid Exposure   Virtual consult for covid exposure this past weekend.  Did a rapid test last night that was negative.  Was out of town in [place] this past weekend for new years.   This past weekend had sore throat, cough, deep cough but dry, slight fever, fatigue.  Not much congestion.  Has had some loose stool, no nausea or vomiting.  Has had some chills.   Using tylenol and cough medication.  Still has some cough, but most other symptoms have resolved.    Her son is positive for covid though, he is 63yo.  He has sore throat, mild symptoms.   She has had the covid vaccine and booster.    No other aggravating or relieving factors. No other complaint.   Past Medical History:  Diagnosis Date  . ADD (attention deficit disorder)   . Anxiety   . Chlamydia   . Complication of anesthesia    "resistant to  anesthesia"  . Cyst of cervix   . Depression   . Depression, major, in remission ([place]) 05/2019  . Heart murmur   . History of alcohol abuse   . Hx of varicella     Review of Systems As in subjective    Objective:   Physical Exam Due to coronavirus pandemic stay at home measures, patient visit was virtual and they were not examined in person.    Gen: wd, wn, nad No obvious wheezing or SOB Lots of coughing    Assessment:     Encounter Diagnoses  Name Primary?  . Close exposure to COVID-19 virus Yes  . Cough        Plan:     Given her exposure and significant cough she will come in for Covid testing.  She did have a negative home test but has an household contact with a positive.  She has had her vaccines.  We discussed supportive care measures including rest, hydration, Tylenol for aches or fever, medication as below.  She will need a work note and a PCR test per her employer.  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  I advise quarantine for now likely out the rest of the week  Zarriah was seen today for covid exposure.  Diagnoses and all orders for this visit:  Close exposure to COVID-19 virus -  POC COVID-19 BinaxNow; Future -     Novel Coronavirus, NAA ([facility]); Future  Cough -     POC COVID-19 BinaxNow; Future -     Novel Coronavirus, NAA ([facility]); Future  Other orders -     chlorpheniramine-HYDROcodone (TUSSIONEX PENNKINETIC ER) 10-8 MG/5ML SUER; Take 5 mLs by mouth 2 (two) times daily.     Follow-up in our back parking lot today for Covid testing as above

## 2020-03-01 ENCOUNTER — Other Ambulatory Visit: Payer: BC Managed Care – PPO

## 2020-03-01 LAB — SARS-COV-2, NAA 2 DAY TAT

## 2020-03-01 LAB — NOVEL CORONAVIRUS, NAA: SARS-CoV-2, NAA: NOT DETECTED

## 2020-03-13 ENCOUNTER — Other Ambulatory Visit: Payer: Self-pay | Admitting: Medical

## 2020-03-13 DIAGNOSIS — Z1231 Encounter for screening mammogram for malignant neoplasm of breast: Secondary | ICD-10-CM

## 2020-03-16 ENCOUNTER — Other Ambulatory Visit: Payer: Self-pay

## 2020-03-16 ENCOUNTER — Telehealth: Payer: Self-pay | Admitting: Medical

## 2020-03-16 ENCOUNTER — Encounter: Payer: Self-pay | Admitting: Family Medicine

## 2020-03-16 ENCOUNTER — Other Ambulatory Visit: Payer: Self-pay | Admitting: Medical

## 2020-03-16 ENCOUNTER — Telehealth: Payer: BC Managed Care – PPO | Admitting: Family Medicine

## 2020-03-16 VITALS — Temp 99.6°F | Wt 215.0 lb

## 2020-03-16 DIAGNOSIS — R0602 Shortness of breath: Secondary | ICD-10-CM

## 2020-03-16 DIAGNOSIS — R0981 Nasal congestion: Secondary | ICD-10-CM

## 2020-03-16 DIAGNOSIS — R059 Cough, unspecified: Secondary | ICD-10-CM

## 2020-03-16 DIAGNOSIS — J3489 Other specified disorders of nose and nasal sinuses: Secondary | ICD-10-CM

## 2020-03-16 DIAGNOSIS — R432 Parageusia: Secondary | ICD-10-CM

## 2020-03-16 DIAGNOSIS — U071 COVID-19: Secondary | ICD-10-CM | POA: Insufficient documentation

## 2020-03-16 MED ORDER — PROMETHAZINE-DM 6.25-15 MG/5ML PO SYRP: 5.0000 mL | ORAL_SOLUTION | Freq: Four times a day (QID) | ORAL | 0 refills | Status: DC | PRN

## 2020-03-16 MED ORDER — EMERGEN-C IMMUNE PLUS PO PACK
1.0000 | PACK | Freq: Two times a day (BID) | ORAL | 0 refills | Status: DC
Start: 2020-03-16 — End: 2021-04-18

## 2020-03-16 NOTE — Telephone Encounter (Signed)
Please see her email message.  This was a MyChart nonurgent message sent to me.  I did send in a cough syrup and a vitamin pack to her pharmacy.  If she tested positive for COVID and has current symptoms such as cough fever or sneezing or congestion she needs to quarantine until she does not have those particular symptoms  Write a tentative work note out of work.  She works in a nursing facility.  Please also ask, when I saw her for virtual a few weeks ago I think she had COVID exposure but I believe she did test positive at that time correct  It would be odd to get COVID twice in a row that short of time apart  She does not need a PCR test if she had a rapid antigen test

## 2020-03-16 NOTE — Progress Notes (Signed)
   Subjective:  Documentation for virtual audio and video telecommunications through [place] encounter:  The patient was located at home. 2 patient identifiers used.  The provider was located in the office. The patient did consent to this visit and is aware of possible charges through their insurance for this visit.  The other persons participating in this telemedicine service were none. Time spent on call was 16 minutes and in review of previous records 20 minutes total.  This virtual service is not related to other E/M service within previous 7 days.   Patient ID: Nicole Robinson, female    DOB: 11/27/1980, 40 y.o.   MRN: [id number]  HPI Chief Complaint  Patient presents with  . sick    Symptoms- yesterday runny nose,tired, chest congestion, sob, nausea, vomiting, loss of taste, rapid test positive   States she had a positive covid rapid test yesterday. States her symptoms started 2 days ago. She had nasal dryness and burning.  States she now has fatigue, rhinorrhea, sneezing, dry cough, and shortness of breath.  Loss of taste.   States she is drinking fluids but is not taking any medications.   She has had all 3 Covid vaccines and flu vaccines.   Denies fever, chills, dizziness, chest pain, palpitations, abdominal pain, diarrhea.    Review of Systems Pertinent positives and negatives in the history of present illness.     Objective:   Physical Exam Temp 99.6 F (37.6 C)   Wt 215 lb (97.5 kg)   BMI 39.32 kg/m   Alert and oriented and in no acute distress.  Speaking in complete sentences without difficulty.  She is coughing and sounds congested throughout the visit.      Assessment & Plan:  COVID-19 virus infection  Cough  Nasal congestion with rhinorrhea  Shortness of breath  Loss of taste  Positive COVID test yesterday.  She is aware that she will need to quarantine until Tuesday 03/20/2020 and if she is basically back to normal she can wear her N95 and  go back to school and work.  If she is still having symptoms on 03/20/2020 and she will need to quarantine for full 10 days per [facility] guidelines.  I will provide her a note stating this.  We discussed supportive care.  We also discussed when to seek medical attention such as severe shortness of breath, chest pain, dehydration, etc.

## 2020-03-16 NOTE — Telephone Encounter (Signed)
Nicole Robinson didn't know pt had Virtual visit with Vickie this afternoon, message was sent to him prior to appt being scheduled.  I called and left pt a message and note has already been written by [place]

## 2020-03-17 NOTE — Telephone Encounter (Signed)
done

## 2020-03-19 DIAGNOSIS — Z20828 Contact with and (suspected) exposure to other viral communicable diseases: Secondary | ICD-10-CM | POA: Diagnosis not present

## 2020-03-19 DIAGNOSIS — Z1159 Encounter for screening for other viral diseases: Secondary | ICD-10-CM | POA: Diagnosis not present

## 2020-03-22 DIAGNOSIS — Z1159 Encounter for screening for other viral diseases: Secondary | ICD-10-CM | POA: Diagnosis not present

## 2020-03-22 DIAGNOSIS — Z20828 Contact with and (suspected) exposure to other viral communicable diseases: Secondary | ICD-10-CM | POA: Diagnosis not present

## 2020-04-09 DIAGNOSIS — Z20828 Contact with and (suspected) exposure to other viral communicable diseases: Secondary | ICD-10-CM | POA: Diagnosis not present

## 2020-04-09 DIAGNOSIS — Z1159 Encounter for screening for other viral diseases: Secondary | ICD-10-CM | POA: Diagnosis not present

## 2020-04-10 DIAGNOSIS — F902 Attention-deficit hyperactivity disorder, combined type: Secondary | ICD-10-CM | POA: Diagnosis not present

## 2020-04-10 DIAGNOSIS — F419 Anxiety disorder, unspecified: Secondary | ICD-10-CM | POA: Diagnosis not present

## 2020-04-10 DIAGNOSIS — F325 Major depressive disorder, single episode, in full remission: Secondary | ICD-10-CM | POA: Diagnosis not present

## 2020-05-23 DIAGNOSIS — F902 Attention-deficit hyperactivity disorder, combined type: Secondary | ICD-10-CM | POA: Diagnosis not present

## 2020-05-23 DIAGNOSIS — F419 Anxiety disorder, unspecified: Secondary | ICD-10-CM | POA: Diagnosis not present

## 2020-05-23 DIAGNOSIS — F325 Major depressive disorder, single episode, in full remission: Secondary | ICD-10-CM | POA: Diagnosis not present

## 2020-05-28 DIAGNOSIS — Z111 Encounter for screening for respiratory tuberculosis: Secondary | ICD-10-CM | POA: Diagnosis not present

## 2020-06-20 DIAGNOSIS — F902 Attention-deficit hyperactivity disorder, combined type: Secondary | ICD-10-CM | POA: Diagnosis not present

## 2020-06-20 DIAGNOSIS — F419 Anxiety disorder, unspecified: Secondary | ICD-10-CM | POA: Diagnosis not present

## 2020-06-20 DIAGNOSIS — F325 Major depressive disorder, single episode, in full remission: Secondary | ICD-10-CM | POA: Diagnosis not present

## 2020-07-18 DIAGNOSIS — F419 Anxiety disorder, unspecified: Secondary | ICD-10-CM | POA: Diagnosis not present

## 2020-07-18 DIAGNOSIS — F325 Major depressive disorder, single episode, in full remission: Secondary | ICD-10-CM | POA: Diagnosis not present

## 2020-07-18 DIAGNOSIS — F902 Attention-deficit hyperactivity disorder, combined type: Secondary | ICD-10-CM | POA: Diagnosis not present

## 2020-09-22 ENCOUNTER — Other Ambulatory Visit: Payer: Self-pay

## 2020-09-22 ENCOUNTER — Other Ambulatory Visit (HOSPITAL_COMMUNITY): Payer: Self-pay

## 2020-09-22 ENCOUNTER — Emergency Department (HOSPITAL_COMMUNITY)
Admission: EM | Admit: 2020-09-22 | Discharge: 2020-09-23 | Disposition: A | Discharge status: Home / Self Care | Payer: Self-pay | Attending: Emergency Medicine | Admitting: Emergency Medicine

## 2020-09-22 DIAGNOSIS — M25562 Pain in left knee: Secondary | ICD-10-CM | POA: Insufficient documentation

## 2020-09-22 DIAGNOSIS — Z87891 Personal history of nicotine dependence: Secondary | ICD-10-CM | POA: Insufficient documentation

## 2020-09-22 DIAGNOSIS — Z8616 Personal history of COVID-19: Secondary | ICD-10-CM | POA: Insufficient documentation

## 2020-09-22 DIAGNOSIS — S8992XA Unspecified injury of left lower leg, initial encounter: Secondary | ICD-10-CM | POA: Insufficient documentation
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
## 2020-09-23 ENCOUNTER — Encounter (HOSPITAL_COMMUNITY): Payer: Self-pay | Admitting: Student

## 2020-09-23 ENCOUNTER — Emergency Department (HOSPITAL_COMMUNITY): Payer: Self-pay

## 2020-09-23 MED ORDER — NAPROXEN 500 MG PO TABS: 500.0000 mg | ORAL_TABLET | Freq: Two times a day (BID) | ORAL | 0 refills | Status: AC | PRN
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
## 2020-09-23 NOTE — ED Notes (Signed)
Ortho tech at bedside 

## 2020-09-23 NOTE — Progress Notes (Signed)
Orthopedic Tech Progress Note Patient Details:  Nicole Robinson 07-Oct-1980 [id number]   Ortho Devices Type of Ortho Device: Crutches, Knee Immobilizer Ortho Device/Splint Location: LLE Ortho Device/Splint Interventions: Ordered, Application, Adjustment   Post Interventions Patient Tolerated: Well Instructions Provided: Care of device, Adjustment of device, Poper ambulation with device  [name] [name] 09/23/2020, 1:45 PM

## 2020-09-23 NOTE — Discharge Instructions (Addendum)
Please read and follow all provided instructions.  You have been seen today for an injury to your right knee.   Tests performed today include: An x-ray of the affected area - does NOT show any broken bones or dislocations.  Vital signs. See below for your results today.   Home care instructions: -- *PRICE in the first 24-48 hours after injury: Protect (with brace, splint, sling), if given by your provider Rest Ice- Do not apply ice pack directly to your skin, place towel or similar between your skin and ice/ice pack. Apply ice for 20 min, then remove for 40 min while awake Compression- Wear brace, elastic bandage, splint as directed by your provider Elevate affected extremity above the level of your heart when not walking around for the first 24-48 hours   Medications:   - Naproxen is a nonsteroidal anti-inflammatory medication that will help with pain and swelling. Be sure to take this medication as prescribed with food, 1 pill every 12 hours,  It should be taken with food, as it can cause stomach upset, and more seriously, stomach bleeding. Do not take other nonsteroidal anti-inflammatory medications with this such as Advil, Motrin, Aleve, Mobic, Goodie Powder, or Motrin.     You make take Tylenol per over the counter dosing with these medications.   We have prescribed you new medication(s) today. Discuss the medications prescribed today with your pharmacist as they can have adverse effects and interactions with your other medicines including over the counter and prescribed medications. Seek medical evaluation if you start to experience new or abnormal symptoms after taking one of these medicines, seek care immediately if you start to experience difficulty breathing, feeling of your throat closing, facial swelling, or rash as these could be indications of a more serious allergic reaction   Follow-up instructions: Please follow-up with your primary care provider or the provided orthopedic  physician (bone specialist) if you continue to have significant pain in 1 week. In this case you may have a more severe injury that requires further care.   Return instructions:  Please return if your digits or extremity are numb or tingling, appear gray or blue, or you have severe pain (also elevate the extremity and loosen splint or wrap if you were given one) Please return if you have redness or fevers.  Please return to the Emergency Department if you experience worsening symptoms.  Please return if you have any other emergent concerns. Additional Information:  Your vital signs today were: BP 110/70   Pulse 69   Temp 98 F (36.7 C) (Oral)   Resp 16   Ht '5\' 6"'$  (1.676 m)   Wt 97.5 kg   SpO2 100%   BMI 34.70 kg/m  If your blood pressure (BP) was elevated above 135/85 this visit, please have this repeated by your doctor within one month. ---------------

## 2021-04-18 ENCOUNTER — Ambulatory Visit: Payer: PRIVATE HEALTH INSURANCE | Admitting: Medical

## 2021-04-18 ENCOUNTER — Other Ambulatory Visit: Payer: Self-pay

## 2021-04-18 VITALS — BP 120/70 | HR 86 | Wt 199.8 lb

## 2021-04-18 DIAGNOSIS — S99921A Unspecified injury of right foot, initial encounter: Secondary | ICD-10-CM | POA: Diagnosis not present

## 2021-04-18 DIAGNOSIS — F325 Major depressive disorder, single episode, in full remission: Secondary | ICD-10-CM

## 2021-04-18 DIAGNOSIS — F909 Attention-deficit hyperactivity disorder, unspecified type: Secondary | ICD-10-CM | POA: Diagnosis not present

## 2021-04-18 MED ORDER — AMPHETAMINE-DEXTROAMPHET ER 20 MG PO CP24: 40.0000 mg | ORAL_CAPSULE | Freq: Every day | ORAL | 0 refills | Status: DC
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
## 2021-04-18 NOTE — Patient Instructions (Addendum)
Consider Mydayis, Vyvnase or other simpler or safer regimen  Consider post op shoe vs Cam Walker boot.  Use the supportive boot for 2 weeks initially along with cool water therapy, short term daily Aleve, relative rest.  Consider purchasing on [employer] or location below   [facility] [address], [place], [place] [address] [phone] phone

## 2021-07-23 ENCOUNTER — Other Ambulatory Visit: Payer: Self-pay | Admitting: Medical

## 2021-07-23 MED ORDER — AMPHETAMINE-DEXTROAMPHET ER 20 MG PO CP24: 40.0000 mg | ORAL_CAPSULE | Freq: Every day | ORAL | 0 refills | Status: DC

## 2021-08-01 ENCOUNTER — Telehealth: Payer: Self-pay

## 2021-08-01 ENCOUNTER — Other Ambulatory Visit: Payer: Self-pay | Admitting: Medical

## 2021-08-01 MED ORDER — AMPHETAMINE-DEXTROAMPHET ER 20 MG PO CP24: 40.0000 mg | ORAL_CAPSULE | Freq: Every day | ORAL | 0 refills | Status: DC

## 2021-08-01 NOTE — Telephone Encounter (Signed)
Pt. Called stated she has still not received her Adderall that was called in on 07/23/21. She called her pharmacy and I also called her pharmacy and they told me that they are out of stock and do not know when they are going to get it in. She would like it sent to [facility]. [place]. She has been out of the medicine for a while and having trouble focusing at work.

## 2021-08-01 NOTE — Telephone Encounter (Signed)
Pt called back and states that the [facility] is out and would like it sent to the  [facility] on [address] [place], [place] [address]  States this will also be her new [pharmacy

## 2021-10-30 ENCOUNTER — Encounter: Payer: Self-pay | Admitting: Internal Medicine

## 2021-10-30 ENCOUNTER — Other Ambulatory Visit: Payer: Self-pay | Admitting: Medical

## 2021-10-30 MED ORDER — AMPHETAMINE-DEXTROAMPHET ER 20 MG PO CP24: 40.0000 mg | ORAL_CAPSULE | Freq: Every day | ORAL | 0 refills | Status: AC

## 2021-11-07 ENCOUNTER — Telehealth: Payer: Self-pay

## 2021-11-07 NOTE — Telephone Encounter (Signed)
Pt. Called stating her Adderall prescription on 10/30/21 has been taking in for about 1 week but it is not as effective. She said it was generic or a different brand or something didn't look or work the same as the usual adderall she gets. She wanted to know if that could be switched back to her other Adderall.

## 2021-11-11 ENCOUNTER — Encounter: Payer: PRIVATE HEALTH INSURANCE | Admitting: Medical

## 2021-11-25 ENCOUNTER — Telehealth: Payer: Self-pay | Admitting: Medical

## 2021-11-25 NOTE — Telephone Encounter (Signed)
Dismissal letter in guarantor snapshot  °

## 2021-12-09 ENCOUNTER — Other Ambulatory Visit: Payer: Self-pay

## 2021-12-09 DIAGNOSIS — Z5321 Procedure and treatment not carried out due to patient leaving prior to being seen by health care provider: Secondary | ICD-10-CM | POA: Insufficient documentation

## 2021-12-09 DIAGNOSIS — M546 Pain in thoracic spine: Secondary | ICD-10-CM | POA: Insufficient documentation

## 2021-12-09 NOTE — ED Triage Notes (Signed)
Pt presents via POV c/o upper back back x2 days. No injury per pt report. Reports unrelieved with OTC meds.

## 2021-12-09 NOTE — ED Notes (Signed)
See triage note. Pt states pain in lower left flank, radiating to left hip. Sx onset yesterday. Denies pain with urination or blood in urine, fevers/chills, NVD. Pt A&Ox4, appears anxious due to pain.

## 2021-12-09 NOTE — ED Notes (Addendum)
No answer when called several times from lobby; no answer when phone # listed in chart called 

## 2021-12-10 ENCOUNTER — Emergency Department
Admission: EM | Admit: 2021-12-10 | Discharge: 2021-12-10 | Discharge status: Against Medical Advice | Payer: Self-pay | Attending: Emergency Medicine | Admitting: Emergency Medicine

## 2021-12-10 NOTE — ED Notes (Signed)
No answer when called several times from lobby 

## 2021-12-10 NOTE — ED Notes (Signed)
Called pt's name in waiting room, no answer.

## 2022-04-20 ENCOUNTER — Emergency Department
Admission: EM | Admit: 2022-04-20 | Discharge: 2022-04-20 | Disposition: A | Discharge status: Home / Self Care | Payer: PRIVATE HEALTH INSURANCE | Attending: Emergency Medicine | Admitting: Emergency Medicine

## 2022-04-20 ENCOUNTER — Other Ambulatory Visit: Payer: Self-pay

## 2022-04-20 DIAGNOSIS — Z1152 Encounter for screening for COVID-19: Secondary | ICD-10-CM | POA: Diagnosis not present

## 2022-04-20 DIAGNOSIS — J101 Influenza due to other identified influenza virus with other respiratory manifestations: Secondary | ICD-10-CM | POA: Insufficient documentation

## 2022-04-20 DIAGNOSIS — R509 Fever, unspecified: Secondary | ICD-10-CM | POA: Diagnosis present

## 2022-04-20 LAB — RESP PANEL BY RT-PCR (RSV, FLU A&B, COVID)  RVPGX2
Influenza A by PCR: POSITIVE — AB
Influenza B by PCR: NEGATIVE
Resp Syncytial Virus by PCR: NEGATIVE
SARS Coronavirus 2 by RT PCR: NEGATIVE

## 2022-04-20 MED ORDER — HYDROCOD POLI-CHLORPHE POLI ER 10-8 MG/5ML PO SUER
5.0000 mL | Freq: Once | ORAL | Status: AC
  Administered 2022-04-20: 5 mL via ORAL
  Filled 2022-04-20: qty 5

## 2022-04-20 MED ORDER — IBUPROFEN 600 MG PO TABS
600.0000 mg | ORAL_TABLET | Freq: Once | ORAL | Status: AC
  Administered 2022-04-20: 600 mg via ORAL
  Filled 2022-04-20: qty 1

## 2022-04-20 MED ORDER — HYDROCOD POLI-CHLORPHE POLI ER 10-8 MG/5ML PO SUER: 5.0000 mL | Freq: Two times a day (BID) | ORAL | 0 refills | Status: AC | PRN

## 2022-04-20 NOTE — ED Provider Notes (Signed)
   [facility] Provider Note    Event Date/Time   First MD Initiated Contact with Patient 04/20/22 2011     (approximate)  History   Chief Complaint: Fever and Cough  HPI  Nicole Robinson is a 42 y.o. female with a past medical history of anxiety, presents to the emergency department for 3 days of cough congestion fever.  According to the patient for the past 3 days or so she has been coughing with fever and congestion.  Has been using Tylenol and ibuprofen but continues to have a fever so she came to the emergency department.  Patient states slight chest discomfort but only when coughing.  Frequent cough during evaluation.  Physical Exam   Triage Vital Signs: ED Triage Vitals  Enc Vitals Group     BP 04/20/22 1905 132/81     Pulse Rate 04/20/22 1905 98     Resp 04/20/22 1905 20     Temp 04/20/22 1905 (!) 101.6 F (38.7 C)     Temp Source 04/20/22 1905 Oral     SpO2 04/20/22 1905 98 %     Weight 04/20/22 1906 211 lb 9.6 oz (96 kg)     Height 04/20/22 1906 5' 3"$  (1.6 m)     Head Circumference --      Peak Flow --      Pain Score --      Pain Loc --      Pain Edu? --      Excl. in [place]? --     Most recent vital signs: Vitals:   04/20/22 1905  BP: 132/81  Pulse: 98  Resp: 20  Temp: (!) 101.6 F (38.7 C)  SpO2: 98%    General: Awake, no distress.  CV:  Good peripheral perfusion.  Regular rate and rhythm  Resp:  Normal effort.  Equal breath sounds bilaterally.  Abd:  No distention.  Soft, nontender.  No rebound or guarding.  ED Results / Procedures / Treatments   MEDICATIONS ORDERED IN ED: Medications  ibuprofen (ADVIL) tablet 600 mg (600 mg Oral Given 04/20/22 1912)     IMPRESSION / MDM / ASSESSMENT AND PLAN / ED COURSE  I reviewed the triage vital signs and the nursing notes.  Patient's presentation is most consistent with acute presentation with potential threat to life or bodily function.  Patient presents emergency department for  fever cough congestion x 3 days.  Found to be febrile in the emergency department one 1.6.  Patient's influenza test has resulted positive for influenza A explaining the patient's symptoms.  I discussed with the patient as her symptoms have been ongoing greater than 2 days I do not believe she would benefit from Tamiflu.  Denies any nausea or vomiting.  Discussed with the patient use of 600 mg ibuprofen or 1000 mg of Tylenol every 6 hours, plenty of fluids rest and over-the-counter decongestions if needed.  Will prescribe codeine-based cough medication for the patient.  She is agreeable to plan.  FINAL CLINICAL IMPRESSION(S) / ED DIAGNOSES   Influenza A  Note:  This document was prepared using Dragon voice recognition software and may include unintentional dictation errors.   Harvest Dark, MD 04/20/22 2025

## 2022-04-20 NOTE — ED Triage Notes (Signed)
Pt presents to ER with c/o covid like symptoms that started yesterday.  Pt endorses fever, cough, body aches, congestion and HA.  Pt states there has been covid cases recently at her job.  Pt states her fever at home has been around 102.  States she last took tylenol appx 2 hrs ago.  Pt is otherwise A&O x4 and ambulatory to triage.

## 2022-10-23 ENCOUNTER — Encounter: Payer: Self-pay | Admitting: Emergency Medicine

## 2022-10-23 ENCOUNTER — Emergency Department: Payer: PRIVATE HEALTH INSURANCE

## 2022-10-23 ENCOUNTER — Emergency Department
Admission: EM | Admit: 2022-10-23 | Discharge: 2022-10-23 | Disposition: A | Discharge status: Home / Self Care | Payer: PRIVATE HEALTH INSURANCE | Attending: Emergency Medicine | Admitting: Emergency Medicine

## 2022-10-23 ENCOUNTER — Other Ambulatory Visit: Payer: Self-pay

## 2022-10-23 DIAGNOSIS — U071 COVID-19: Secondary | ICD-10-CM | POA: Insufficient documentation

## 2022-10-23 LAB — SARS CORONAVIRUS 2 BY RT PCR: SARS Coronavirus 2 by RT PCR: POSITIVE — AB

## 2022-10-23 MED ORDER — BENZONATATE 100 MG PO CAPS: 100.0000 mg | ORAL_CAPSULE | Freq: Three times a day (TID) | ORAL | 0 refills | Status: AC | PRN

## 2022-10-23 NOTE — Discharge Instructions (Addendum)
Alternate Tylenol and Ibuprofen for headache and bodyaches. You can take Tessalon Perles for cough.

## 2022-10-23 NOTE — ED Triage Notes (Signed)
Pt arrived via pOV with reports of sore throat, cough, sxs began Tuesday night while at work, took a covid test then but was negative.  Pt reports fever and body aches.

## 2022-10-23 NOTE — ED Provider Notes (Signed)
[facility] Provider Note  Patient Contact: 10:06 PM (approximate)   History   Cough   HPI  Nicole Robinson is a 42 y.o. female presents to the emergency department with headache, body aches, pharyngitis and cough for the past 2 to 3 days.  Patient fever started today.  No chest pain, chest tightness or abdominal pain.  No recent travel.  No sick contacts in the home with similar symptoms.      Physical Exam   Triage Vital Signs: ED Triage Vitals  Encounter Vitals Group     BP 10/23/22 2111 (!) 142/97     Systolic BP Percentile --      Diastolic BP Percentile --      Pulse Rate 10/23/22 2111 88     Resp 10/23/22 2111 20     Temp 10/23/22 2111 99.7 F (37.6 C)     Temp Source 10/23/22 2111 Oral     SpO2 10/23/22 2111 99 %     Weight --      Height --      Head Circumference --      Peak Flow --      Pain Score 10/23/22 2110 7     Pain Loc --      Pain Education --      Exclude from Growth Chart --     Most recent vital signs: Vitals:   10/23/22 2111  BP: (!) 142/97  Pulse: 88  Resp: 20  Temp: 99.7 F (37.6 C)  SpO2: 99%     Constitutional: Alert and oriented. Patient is lying supine. Eyes: Conjunctivae are normal. PERRL. EOMI. Head: Atraumatic. ENT:      Ears: Tympanic membranes are mildly injected with mild effusion bilaterally.       Nose: No congestion/rhinnorhea.      Mouth/Throat: Mucous membranes are moist. Posterior pharynx is mildly erythematous.  Hematological/Lymphatic/Immunilogical: No cervical lymphadenopathy.  Cardiovascular: Normal rate, regular rhythm. Normal S1 and S2.  Good peripheral circulation. Respiratory: Normal respiratory effort without tachypnea or retractions. Lungs CTAB. Good air entry to the bases with no decreased or absent breath sounds. Gastrointestinal: Bowel sounds 4 quadrants. Soft and nontender to palpation. No guarding or rigidity. No palpable masses. No distention. No CVA  tenderness. Musculoskeletal: Full range of motion to all extremities. No gross deformities appreciated. Neurologic:  Normal speech and language. No gross focal neurologic deficits are appreciated.  Skin:  Skin is warm, dry and intact. No rash noted. Psychiatric: Mood and affect are normal. Speech and behavior are normal. Patient exhibits appropriate insight and judgement.    ED Results / Procedures / Treatments   Labs (all labs ordered are listed, but only abnormal results are displayed) Labs Reviewed  SARS CORONAVIRUS 2 BY RT PCR - Abnormal; Notable for the following components:      Result Value   SARS Coronavirus 2 by RT PCR POSITIVE (*)    All other components within normal limits        PROCEDURES:  Critical Care performed: No  Procedures   MEDICATIONS ORDERED IN ED: Medications - No data to display   IMPRESSION / MDM / ASSESSMENT AND PLAN / ED COURSE  I reviewed the triage vital signs and the nursing notes.                              Assessment and plan COVID-[age] year old female presents to the emergency department with  viral URI-like symptoms.  She tested positive for COVID-19.  Chest x-ray unremarkable.  Supportive medications were recommended for home use.  Return precautions were given to return with new or worsening symptoms.  All patient questions were answered.     FINAL CLINICAL IMPRESSION(S) / ED DIAGNOSES   Final diagnoses:  COVID-19     Rx / DC Orders   ED Discharge Orders          Ordered    benzonatate (TESSALON PERLES) 100 MG capsule  3 times daily PRN        10/23/22 2201             Note:  This document was prepared using Dragon voice recognition software and may include unintentional dictation errors.   Gasper Lloyd 10/23/22 2207    Chesley Noon, MD 10/24/22 [phone]

## 2024-03-08 DIAGNOSIS — T383X5A Adverse effect of insulin and oral hypoglycemic [antidiabetic] drugs, initial encounter: Secondary | ICD-10-CM | POA: Insufficient documentation

## 2024-03-08 DIAGNOSIS — R197 Diarrhea, unspecified: Secondary | ICD-10-CM | POA: Insufficient documentation

## 2024-03-08 DIAGNOSIS — R1084 Generalized abdominal pain: Secondary | ICD-10-CM | POA: Insufficient documentation

## 2024-03-08 DIAGNOSIS — R112 Nausea with vomiting, unspecified: Secondary | ICD-10-CM | POA: Insufficient documentation

## 2024-03-09 ENCOUNTER — Emergency Department
Admission: EM | Admit: 2024-03-09 | Discharge: 2024-03-09 | Disposition: A | Payer: Self-pay | Attending: Emergency Medicine | Admitting: Emergency Medicine

## 2024-03-09 ENCOUNTER — Encounter: Payer: Self-pay | Admitting: Emergency Medicine

## 2024-03-09 ENCOUNTER — Other Ambulatory Visit: Payer: Self-pay

## 2024-03-09 DIAGNOSIS — R112 Nausea with vomiting, unspecified: Secondary | ICD-10-CM

## 2024-03-09 DIAGNOSIS — R109 Unspecified abdominal pain: Secondary | ICD-10-CM

## 2024-03-09 DIAGNOSIS — T50905A Adverse effect of unspecified drugs, medicaments and biological substances, initial encounter: Secondary | ICD-10-CM

## 2024-03-09 LAB — RESP PANEL BY RT-PCR (RSV, FLU A&B, COVID)  RVPGX2
Influenza A by PCR: NEGATIVE
Influenza B by PCR: NEGATIVE
Resp Syncytial Virus by PCR: NEGATIVE
SARS Coronavirus 2 by RT PCR: NEGATIVE

## 2024-03-09 LAB — URINALYSIS, ROUTINE W REFLEX MICROSCOPIC
Bilirubin Urine: NEGATIVE
Glucose, UA: NEGATIVE mg/dL
Hgb urine dipstick: NEGATIVE
Ketones, ur: NEGATIVE mg/dL
Leukocytes,Ua: NEGATIVE
Nitrite: NEGATIVE
Protein, ur: NEGATIVE mg/dL
Specific Gravity, Urine: 1.029 (ref 1.005–1.030)
pH: 5 (ref 5.0–8.0)

## 2024-03-09 LAB — LIPASE, BLOOD: Lipase: 19 U/L (ref 11–51)

## 2024-03-09 LAB — COMPREHENSIVE METABOLIC PANEL WITH GFR
ALT: 10 U/L (ref 0–44)
AST: 19 U/L (ref 15–41)
Albumin: 4.2 g/dL (ref 3.5–5.0)
Alkaline Phosphatase: 79 U/L (ref 38–126)
Anion gap: 10 (ref 5–15)
BUN: 11 mg/dL (ref 6–20)
CO2: 22 mmol/L (ref 22–32)
Calcium: 9 mg/dL (ref 8.9–10.3)
Chloride: 102 mmol/L (ref 98–111)
Creatinine, Ser: 0.69 mg/dL (ref 0.44–1.00)
GFR, Estimated: >60 mL/min
Glucose, Bld: 93 mg/dL (ref 70–99)
Potassium: 3.4 mmol/L — ABNORMAL LOW (ref 3.5–5.1)
Sodium: 134 mmol/L — ABNORMAL LOW (ref 135–145)
Total Bilirubin: 0.5 mg/dL (ref 0.0–1.2)
Total Protein: 7.9 g/dL (ref 6.5–8.1)

## 2024-03-09 LAB — CBC
HCT: 34.2 % — ABNORMAL LOW (ref 36.0–46.0)
Hemoglobin: 9.9 g/dL — ABNORMAL LOW (ref 12.0–15.0)
MCH: 19.5 pg — ABNORMAL LOW (ref 26.0–34.0)
MCHC: 28.9 g/dL — ABNORMAL LOW (ref 30.0–36.0)
MCV: 67.3 fL — ABNORMAL LOW (ref 80.0–100.0)
Platelets: 292 K/uL (ref 150–400)
RBC: 5.08 MIL/uL (ref 3.87–5.11)
RDW: 19 % — ABNORMAL HIGH (ref 11.5–15.5)
WBC: 9.4 K/uL (ref 4.0–10.5)
nRBC: 0 % (ref 0.0–0.2)

## 2024-03-09 LAB — MAGNESIUM: Magnesium: 2.2 mg/dL (ref 1.7–2.4)

## 2024-03-09 LAB — POC URINE PREG, ED: Preg Test, Ur: NEGATIVE

## 2024-03-09 MED ORDER — ONDANSETRON 4 MG PO TBDP
4.0000 mg | ORAL_TABLET | Freq: Once | ORAL | Status: AC
  Administered 2024-03-09: 4 mg via ORAL
  Filled 2024-03-09: qty 1

## 2024-03-09 MED ORDER — ONDANSETRON 4 MG PO TBDP: 4.0000 mg | ORAL_TABLET | Freq: Three times a day (TID) | ORAL | 0 refills | Status: AC | PRN

## 2024-03-09 NOTE — ED Triage Notes (Addendum)
 Pt presents to the ED via POV with complaints of diarrhea and vomiting x 4 days after taking an unprescribed GLP-1. She also endorses some generalized body aches & muscle spasms. A&Ox4 at this time. Denies CP or SOB.

## 2024-03-09 NOTE — ED Provider Notes (Signed)
 "  [facility] Provider Note    Event Date/Time   First MD Initiated Contact with Patient 03/09/24 [phone]     (approximate)   History   Diarrhea   HPI  Kobe Southard is a 44 y.o. female   Past medical history of none pertinent health history presents emergency department with abdominal cramping pain, generalized, along with nausea vomiting diarrhea.  No GI bleeding.  No urinary symptoms.  This was the day after she took her friend's Mounjaro.  She is not prescribed Mounjaro.  This is the first time taking this type of medication.  She took it because her friend did not tolerate it and did not want to go to waste and she has been wanting to lose weight.  This was not a self-harm intent.    External Medical Documents Reviewed: Previous hospital notes      Physical Exam   Triage Vital Signs: ED Triage Vitals  Encounter Vitals Group     BP 03/09/24 0007 128/80     Girls Systolic BP Percentile --      Girls Diastolic BP Percentile --      Boys Systolic BP Percentile --      Boys Diastolic BP Percentile --      Pulse Rate 03/09/24 0006 80     Resp 03/09/24 0006 18     Temp 03/09/24 0006 97.9 F (36.6 C)     Temp Source 03/09/24 0006 Oral     SpO2 03/09/24 0006 100 %     Weight 03/09/24 0004 197 lb (89.4 kg)     Height 03/09/24 0004 5' 2 (1.575 m)     Head Circumference --      Peak Flow --      Pain Score 03/09/24 0007 6     Pain Loc --      Pain Education --      Exclude from Growth Chart --     Most recent vital signs: Vitals:   03/09/24 0006 03/09/24 0007  BP:  128/80  Pulse: 80   Resp: 18   Temp: 97.9 F (36.6 C)   SpO2: 100%     General: Awake, no distress.  CV:  Good peripheral perfusion. Resp:  Normal effort.  Abd:  No distention.  Other:  Well.  Nontoxic.  Pleasant woman in no acute distress.  Normal vital signs.  Soft nontender nonfocal nonperitoneal abdominal exam.  Skin appears warm well-perfused euvolemic appearing  overall.   ED Results / Procedures / Treatments   Labs (all labs ordered are listed, but only abnormal results are displayed) Labs Reviewed  COMPREHENSIVE METABOLIC PANEL WITH GFR - Abnormal; Notable for the following components:      Result Value   Sodium 134 (*)    Potassium 3.4 (*)    All other components within normal limits  CBC - Abnormal; Notable for the following components:   Hemoglobin 9.9 (*)    HCT 34.2 (*)    MCV 67.3 (*)    MCH 19.5 (*)    MCHC 28.9 (*)    RDW 19.0 (*)    All other components within normal limits  URINALYSIS, ROUTINE W REFLEX MICROSCOPIC - Abnormal; Notable for the following components:   Color, Urine YELLOW (*)    APPearance HAZY (*)    All other components within normal limits  RESP PANEL BY RT-PCR (RSV, FLU A&B, COVID)  RVPGX2  LIPASE, BLOOD  MAGNESIUM  POC URINE PREG, ED  I ordered and reviewed the above labs they are notable for cell counts electrolytes unremarkable except for microcytic anemia, with no prior in recent past few years for comparison.    PROCEDURES:  Critical Care performed: No  Procedures   MEDICATIONS ORDERED IN ED: Medications  ondansetron  (ZOFRAN -ODT) disintegrating tablet 4 mg (4 mg Oral Given 03/09/24 0450)     IMPRESSION / MDM / ASSESSMENT AND PLAN / ED COURSE  I reviewed the triage vital signs and the nursing notes.                                Patient's presentation is most consistent with acute presentation with potential threat to life or bodily function.  Differential diagnosis includes, but is not limited to, adverse effect of her Mounjaro, stomach virus, gastroenteritis, intra-abdominal infection or obstruction considered but less likely, pregnancy related less likely, UTI less likely    MDM:    1 day after taking Mounjaro developed abdominal cramping nausea vomiting and diarrhea likely related to adverse effect of this medication that she has never taken before.  Benign abdominal exam  rules against surgical abdominal pathologies.  Check labs unremarkable.  Advised to not take this medication.  Given Zofran  and advised adequate hydration for home.  Discharge.        FINAL CLINICAL IMPRESSION(S) / ED DIAGNOSES   Final diagnoses:  Nausea vomiting and diarrhea  Abdominal cramping  Adverse effect of drug, initial encounter     Rx / DC Orders   ED Discharge Orders          Ordered    ondansetron  (ZOFRAN -ODT) 4 MG disintegrating tablet  Every 8 hours PRN        03/09/24 0411             Note:  This document was prepared using Dragon voice recognition software and may include unintentional dictation errors.    Cyrena Mylar, MD 03/09/24 [phone]  "

## 2024-03-09 NOTE — Discharge Instructions (Addendum)
 I think that your symptoms are due to the Mounjaro that you took over the weekend.  Fortunately your blood tests and physical examination did not show any more concerning problems like abdominal infections that would require surgery or any signs of severe dehydration.  Take nausea medication as prescribed. Drink plenty of fluids to stay well-hydrated.  Find Pedialyte or similar electrolyte rehydration formulas at your local pharmacy. Thank you for choosing us  for your health care today!  Please see your primary doctor this week for a follow up appointment.   If you have any new, worsening, or unexpected symptoms call your doctor right away or come back to the emergency department for reevaluation.  It was my pleasure to care for you today.   Ginnie EDISON Cyrena, MD
